# Patient Record
Sex: Female | Born: 1980 | Race: Black or African American | Hispanic: No | Marital: Single | Smoking: Never smoker
Health system: Southern US, Community
[De-identification: ages and names within clinical notes are randomized; demographics above are authoritative.]

## PROBLEM LIST (undated history)

## (undated) HISTORY — PX: TUBAL LIGATION: SHX77

---
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
## 2001-01-25 ENCOUNTER — Inpatient Hospital Stay (HOSPITAL_COMMUNITY): Admission: AD | Admit: 2001-01-25 | Discharge: 2001-01-25 | Payer: Self-pay | Admitting: *Deleted

## 2001-04-06 ENCOUNTER — Emergency Department (HOSPITAL_COMMUNITY): Admission: EM | Admit: 2001-04-06 | Discharge: 2001-04-06 | Payer: Self-pay | Admitting: Emergency Medicine

## 2001-07-01 ENCOUNTER — Inpatient Hospital Stay (HOSPITAL_COMMUNITY): Admission: AD | Admit: 2001-07-01 | Discharge: 2001-07-01 | Payer: Self-pay | Admitting: Obstetrics and Gynecology

## 2001-08-10 ENCOUNTER — Emergency Department (HOSPITAL_COMMUNITY): Admission: EM | Admit: 2001-08-10 | Discharge: 2001-08-10 | Payer: Self-pay | Admitting: Emergency Medicine

## 2002-03-07 ENCOUNTER — Inpatient Hospital Stay (HOSPITAL_COMMUNITY): Admission: AD | Admit: 2002-03-07 | Discharge: 2002-03-07 | Payer: Self-pay | Admitting: Obstetrics and Gynecology

## 2002-10-30 ENCOUNTER — Inpatient Hospital Stay (HOSPITAL_COMMUNITY): Admission: AD | Admit: 2002-10-30 | Discharge: 2002-10-30 | Payer: Self-pay | Admitting: *Deleted

## 2003-01-23 ENCOUNTER — Encounter: Payer: Self-pay | Admitting: Family Medicine

## 2003-01-23 ENCOUNTER — Inpatient Hospital Stay (HOSPITAL_COMMUNITY): Admission: AD | Admit: 2003-01-23 | Discharge: 2003-01-23 | Payer: Self-pay | Admitting: Family Medicine

## 2003-03-01 ENCOUNTER — Inpatient Hospital Stay (HOSPITAL_COMMUNITY): Admission: AD | Admit: 2003-03-01 | Discharge: 2003-03-01 | Payer: Self-pay | Admitting: Obstetrics

## 2003-04-17 ENCOUNTER — Inpatient Hospital Stay (HOSPITAL_COMMUNITY): Admission: AD | Admit: 2003-04-17 | Discharge: 2003-04-17 | Payer: Self-pay | Admitting: Obstetrics & Gynecology

## 2003-06-07 ENCOUNTER — Inpatient Hospital Stay (HOSPITAL_COMMUNITY): Admission: AD | Admit: 2003-06-07 | Discharge: 2003-06-08 | Payer: Self-pay | Admitting: Obstetrics

## 2003-08-24 ENCOUNTER — Inpatient Hospital Stay (HOSPITAL_COMMUNITY): Admission: AD | Admit: 2003-08-24 | Discharge: 2003-08-24 | Payer: Self-pay | Admitting: Obstetrics

## 2003-09-13 ENCOUNTER — Inpatient Hospital Stay (HOSPITAL_COMMUNITY): Admission: AD | Admit: 2003-09-13 | Discharge: 2003-09-15 | Payer: Self-pay | Admitting: Obstetrics

## 2003-09-23 ENCOUNTER — Encounter: Admission: RE | Admit: 2003-09-23 | Discharge: 2003-10-23 | Payer: Self-pay | Admitting: Obstetrics

## 2003-11-23 ENCOUNTER — Encounter: Admission: RE | Admit: 2003-11-23 | Discharge: 2003-12-23 | Payer: Self-pay | Admitting: Obstetrics

## 2004-05-11 ENCOUNTER — Emergency Department (HOSPITAL_COMMUNITY): Admission: EM | Admit: 2004-05-11 | Discharge: 2004-05-11 | Payer: Self-pay | Admitting: Emergency Medicine

## 2004-06-14 ENCOUNTER — Inpatient Hospital Stay (HOSPITAL_COMMUNITY): Admission: AD | Admit: 2004-06-14 | Discharge: 2004-06-14 | Payer: Self-pay | Admitting: Obstetrics

## 2004-06-28 ENCOUNTER — Ambulatory Visit (HOSPITAL_COMMUNITY): Admission: RE | Admit: 2004-06-28 | Discharge: 2004-06-28 | Payer: Self-pay | Admitting: Obstetrics

## 2005-01-27 ENCOUNTER — Inpatient Hospital Stay (HOSPITAL_COMMUNITY): Admission: AD | Admit: 2005-01-27 | Discharge: 2005-01-27 | Payer: Self-pay | Admitting: Obstetrics

## 2005-06-20 ENCOUNTER — Emergency Department (HOSPITAL_COMMUNITY): Admission: EM | Admit: 2005-06-20 | Discharge: 2005-06-20 | Payer: Self-pay | Admitting: Emergency Medicine

## 2006-09-11 ENCOUNTER — Emergency Department (HOSPITAL_COMMUNITY): Admission: EM | Admit: 2006-09-11 | Discharge: 2006-09-11 | Payer: Self-pay | Admitting: Emergency Medicine

## 2007-02-24 ENCOUNTER — Emergency Department (HOSPITAL_COMMUNITY): Admission: EM | Admit: 2007-02-24 | Discharge: 2007-02-24 | Payer: Self-pay | Admitting: Emergency Medicine

## 2009-06-18 ENCOUNTER — Emergency Department (HOSPITAL_COMMUNITY): Admission: EM | Admit: 2009-06-18 | Discharge: 2009-06-18 | Payer: Self-pay | Admitting: Emergency Medicine

## 2009-06-21 ENCOUNTER — Emergency Department (HOSPITAL_COMMUNITY): Admission: EM | Admit: 2009-06-21 | Discharge: 2009-06-21 | Payer: Self-pay | Admitting: Emergency Medicine

## 2010-07-01 ENCOUNTER — Encounter: Payer: Self-pay | Admitting: Obstetrics

## 2010-07-15 ENCOUNTER — Emergency Department (HOSPITAL_COMMUNITY): Payer: No Typology Code available for payment source

## 2010-07-15 ENCOUNTER — Emergency Department (HOSPITAL_COMMUNITY)
Admission: EM | Admit: 2010-07-15 | Discharge: 2010-07-15 | Disposition: A | Discharge status: Home / Self Care | Payer: No Typology Code available for payment source | Attending: Emergency Medicine | Admitting: Emergency Medicine

## 2010-07-15 DIAGNOSIS — S8990XA Unspecified injury of unspecified lower leg, initial encounter: Secondary | ICD-10-CM | POA: Insufficient documentation

## 2010-07-15 DIAGNOSIS — W208XXA Other cause of strike by thrown, projected or falling object, initial encounter: Secondary | ICD-10-CM | POA: Insufficient documentation

## 2010-07-15 DIAGNOSIS — M79609 Pain in unspecified limb: Secondary | ICD-10-CM | POA: Insufficient documentation

## 2010-07-15 DIAGNOSIS — S9030XA Contusion of unspecified foot, initial encounter: Secondary | ICD-10-CM | POA: Insufficient documentation

## 2010-10-26 NOTE — Op Note (Signed)
NAME:  Angie Key, Angie Key                ACCOUNT NO.:  [id number]   MEDICAL RECORD NO.:  [id number]          PATIENT TYPE:  AMB   LOCATION:  SDC                           FACILITY:  [facility]   PHYSICIAN:  Kathreen Cosier, M.D.DATE OF BIRTH:  06-Dec-1980   DATE OF PROCEDURE:  06/28/2004  DATE OF DISCHARGE:                                 OPERATIVE REPORT   PREOPERATIVE DIAGNOSIS:  Multiparity, desirous of laparoscopic tubal  sterilization.   DESCRIPTION OF PROCEDURE:  Under general anesthesia with the patient in the  lithotomy position, the abdomen, perineum and vagina were prepped and  draped.  Bladder emptied with a straight catheter.  Speculum placed in the  vagina.  Anterior lip of the cervix grasped with a Hulka tenaculum.  In the  umbilicus, a transverse incision was made and carried down through the  fascia.  The fascia was cleaned and grasped with two Kochers.  The fascia in  the peritoneum was opened with Mayo scissors.  The sleeve of the trocar was  inserted intraperitoneally.  Three liters of carbon dioxide were infused  intraperitoneally.  Visualizing scope inserted through the sleeve of the  trocar.  Uterus, tubes and ovaries normal.  Cautery probe inserted through  the sleeve of the scope.  The right tube was grasped 1 inch from the cornu  and cauterized.  The tube was cauterized at a total of four places moving  lateral from the first site of cautery.  Procedure done in exact fashion on  the other side.  The patient tolerated the procedure well.  CO2 allowed to  escape from the peritoneal cavity.  Fascia closed with one stitch of 0 Dexon  and the skin closed with subcuticular 3-0 Novafil.      BAM/MEDQ  D:  06/28/2004  T:  06/28/2004  Job:  [id number]

## 2010-11-19 ENCOUNTER — Inpatient Hospital Stay (INDEPENDENT_AMBULATORY_CARE_PROVIDER_SITE_OTHER)
Admission: RE | Admit: 2010-11-19 | Discharge: 2010-11-19 | Disposition: A | Discharge status: Home / Self Care | Payer: No Typology Code available for payment source | Source: Ambulatory Visit | Attending: Emergency Medicine | Admitting: Emergency Medicine

## 2010-11-19 DIAGNOSIS — R42 Dizziness and giddiness: Secondary | ICD-10-CM

## 2010-11-19 LAB — POCT PREGNANCY, URINE: Preg Test, Ur: NEGATIVE

## 2010-11-19 LAB — POCT I-STAT, CHEM 8
BUN: 17 mg/dL (ref 6–23)
Creatinine, Ser: 1 mg/dL (ref 0.4–1.2)
Potassium: 3.8 mEq/L (ref 3.5–5.1)
Sodium: 142 mEq/L (ref 135–145)
TCO2: 23 mmol/L (ref 0–100)

## 2011-03-15 ENCOUNTER — Emergency Department (HOSPITAL_COMMUNITY)
Admission: EM | Admit: 2011-03-15 | Discharge: 2011-03-15 | Disposition: A | Discharge status: Home / Self Care | Payer: No Typology Code available for payment source | Attending: Emergency Medicine | Admitting: Emergency Medicine

## 2011-03-15 ENCOUNTER — Emergency Department (HOSPITAL_COMMUNITY): Payer: No Typology Code available for payment source

## 2011-03-15 DIAGNOSIS — M546 Pain in thoracic spine: Secondary | ICD-10-CM | POA: Insufficient documentation

## 2011-03-15 DIAGNOSIS — S139XXA Sprain of joints and ligaments of unspecified parts of neck, initial encounter: Secondary | ICD-10-CM | POA: Insufficient documentation

## 2011-03-15 DIAGNOSIS — S40029A Contusion of unspecified upper arm, initial encounter: Secondary | ICD-10-CM | POA: Insufficient documentation

## 2011-03-15 DIAGNOSIS — Y92410 Unspecified street and highway as the place of occurrence of the external cause: Secondary | ICD-10-CM | POA: Insufficient documentation

## 2011-03-18 ENCOUNTER — Emergency Department (HOSPITAL_COMMUNITY)
Admission: EM | Admit: 2011-03-18 | Discharge: 2011-03-18 | Disposition: A | Discharge status: Home / Self Care | Payer: No Typology Code available for payment source | Attending: Emergency Medicine | Admitting: Emergency Medicine

## 2011-03-18 ENCOUNTER — Emergency Department (HOSPITAL_COMMUNITY): Payer: No Typology Code available for payment source

## 2011-03-18 DIAGNOSIS — R51 Headache: Secondary | ICD-10-CM | POA: Insufficient documentation

## 2011-03-18 DIAGNOSIS — R404 Transient alteration of awareness: Secondary | ICD-10-CM | POA: Insufficient documentation

## 2011-03-18 DIAGNOSIS — R110 Nausea: Secondary | ICD-10-CM | POA: Insufficient documentation

## 2011-03-18 DIAGNOSIS — S0990XA Unspecified injury of head, initial encounter: Secondary | ICD-10-CM | POA: Insufficient documentation

## 2011-07-24 ENCOUNTER — Emergency Department (HOSPITAL_COMMUNITY)
Admission: EM | Admit: 2011-07-24 | Discharge: 2011-07-24 | Disposition: A | Discharge status: Home / Self Care | Payer: Self-pay | Attending: Emergency Medicine | Admitting: Emergency Medicine

## 2011-07-24 ENCOUNTER — Encounter (HOSPITAL_COMMUNITY): Payer: Self-pay

## 2011-07-24 DIAGNOSIS — L03011 Cellulitis of right finger: Secondary | ICD-10-CM

## 2011-07-24 DIAGNOSIS — M79609 Pain in unspecified limb: Secondary | ICD-10-CM | POA: Insufficient documentation

## 2011-07-24 DIAGNOSIS — L03019 Cellulitis of unspecified finger: Secondary | ICD-10-CM | POA: Insufficient documentation

## 2011-07-24 DIAGNOSIS — L02519 Cutaneous abscess of unspecified hand: Secondary | ICD-10-CM | POA: Insufficient documentation

## 2011-07-24 DIAGNOSIS — M7989 Other specified soft tissue disorders: Secondary | ICD-10-CM | POA: Insufficient documentation

## 2011-07-24 MED ORDER — CEPHALEXIN 500 MG PO CAPS: 500.0000 mg | ORAL_CAPSULE | Freq: Four times a day (QID) | ORAL | Status: AC

## 2011-07-24 NOTE — Discharge Instructions (Signed)
Please read over the instructions below. The infection and the tip of the right index finger may be an early paronychia. There is no indication to open the wound at this time. Take the antibiotic as directed and be sure to complete. He should soak the finger several times a day in warm water for 15-20 minutes. If the finger is improving over the next 48 hours then continue the anabiotic until it is complete. However if you feel the finger swelling pain and redness has worsened and returned to the current [facility] care facility for a wound recheck. Return to the emergency department for worsening symptoms

## 2011-07-24 NOTE — ED Provider Notes (Signed)
History     CSN: [id number]  Arrival date & time 07/24/11  1842   First MD Initiated Contact with Patient 07/24/11 2121      Chief Complaint  Patient presents with  . Hand Pain     Patient is a 31 y.o. female presenting with hand pain. The history is provided by the patient.  Hand Pain This is a new problem. The current episode started yesterday. The problem occurs constantly. The problem has been gradually worsening. Pertinent negatives include no fever.  Pt reports mild soreness to her (R) index fingertip yesterday that has persisted. Denies injury though pt admits to biting on her cuticles at times.. States noted some mild redness to the lateral aspect of the fingertip today and was concerned for infection.  History reviewed. No pertinent past medical history.  Past Surgical History  Procedure Date  . Tubal ligation     No family history on file.  History  Substance Use Topics  . Smoking status: Not on file  . Smokeless tobacco: Not on file  . Alcohol Use: Yes    OB History    Grav Para Term Preterm Abortions TAB SAB Ect Mult Living                  Review of Systems  Constitutional: Negative.  Negative for fever.  HENT: Negative.   Eyes: Negative.   Respiratory: Negative.   Cardiovascular: Negative.   Gastrointestinal: Negative.   Genitourinary: Negative.   Musculoskeletal: Negative.   Skin: Negative.   Neurological: Negative.   Hematological: Negative.   Psychiatric/Behavioral: Negative.     Allergies  Review of patient's allergies indicates no known allergies.  Home Medications   Current Outpatient Rx  Name Route Sig Dispense Refill  . CEPHALEXIN 500 MG PO CAPS Oral Take 1 capsule (500 mg total) by mouth 4 (four) times daily. 28 capsule 0    BP 116/79  Pulse 87  Temp(Src) 98.1 F (36.7 C) (Oral)  Resp 16  SpO2 97%  LMP 07/15/2011  Physical Exam  Constitutional: She is oriented to person, place, and time. She appears well-developed and  well-nourished.  HENT:  Head: Normocephalic and atraumatic.  Eyes: Conjunctivae are normal.  Cardiovascular: Normal rate.   Pulmonary/Chest: Effort normal.  Musculoskeletal: Normal range of motion.       Hands:      Mild erythema and minimal swelling to lateral aspect of (R) index finger parallel w/ nailbed. There is no palpable fluctuance. Findings c/w early paronychia.  Neurological: She is alert and oriented to person, place, and time.  Skin: Skin is warm and dry. No erythema.  Psychiatric: She has a normal mood and affect.    ED Course  Procedures   Findings and clinical impression discussed w/ pt. Will plan to discharge home on antibiotics, instructions for warm soaks and encourage patient to follow up at the [facility] if finger not improving over the next 1 to 2 days. Patient agreeable with plan.  Labs Reviewed - No data to display No results found.   1. Cellulitis of finger, right       MDM  HPI/PE and clinical findings c/w  1. Mild cellulitis to (R) index fingertip (Likely early paronychia)        Leanne Chang, NP 07/24/11 2226

## 2011-07-24 NOTE — ED Notes (Signed)
Patient discharged home with prescription.  Verbalized understanding

## 2011-07-24 NOTE — ED Notes (Signed)
Patient has red area noted to index finger right hand.  States painful x 2 days.

## 2011-07-24 NOTE — ED Notes (Signed)
Pt presented to the ER with pain and swelling to the rt index finger x 2 days

## 2011-07-25 NOTE — ED Provider Notes (Signed)
Medical screening examination/treatment/procedure(s) were performed by non-physician practitioner and as supervising physician I was immediately available for consultation/collaboration.  Doug Sou, MD 07/25/11 [phone]

## 2011-10-31 ENCOUNTER — Emergency Department (HOSPITAL_COMMUNITY)
Admission: EM | Admit: 2011-10-31 | Discharge: 2011-10-31 | Disposition: A | Discharge status: Home / Self Care | Payer: Self-pay | Attending: Emergency Medicine | Admitting: Emergency Medicine

## 2011-10-31 ENCOUNTER — Encounter (HOSPITAL_COMMUNITY): Payer: Self-pay | Admitting: Emergency Medicine

## 2011-10-31 DIAGNOSIS — R51 Headache: Secondary | ICD-10-CM | POA: Insufficient documentation

## 2011-10-31 DIAGNOSIS — J029 Acute pharyngitis, unspecified: Secondary | ICD-10-CM | POA: Insufficient documentation

## 2011-10-31 LAB — RAPID STREP SCREEN (MED CTR MEBANE ONLY): Streptococcus, Group A Screen (Direct): NEGATIVE

## 2011-10-31 MED ORDER — KETOROLAC TROMETHAMINE 30 MG/ML IJ SOLN
30.0000 mg | Freq: Once | INTRAMUSCULAR | Status: AC
  Administered 2011-10-31: 30 mg via INTRAMUSCULAR
  Filled 2011-10-31: qty 1

## 2011-10-31 MED ORDER — IBUPROFEN 50 MG PO CHEW: 600.0000 mg | CHEWABLE_TABLET | Freq: Four times a day (QID) | ORAL | Status: AC | PRN

## 2011-10-31 NOTE — Discharge Instructions (Signed)
Your strep test is negative.  U. been given a prescription for chewable ibuprofen for comfort, you can also use a decongestant, like Benadryl or Sudafed for congestion

## 2011-10-31 NOTE — ED Notes (Signed)
Patient states she had headache yesterday, subsided mostly today, now with sore throat.

## 2011-10-31 NOTE — ED Provider Notes (Signed)
History     CSN: [id number]  Arrival date & time 10/31/11  2009   None     Chief Complaint  Patient presents with  . Sore Throat    (Consider location/radiation/quality/duration/timing/severity/associated sxs/prior treatment) HPI Comments: A tissue study yesterday with a headache, today.  She's still has a headache, sore throat, facial fullness, without rhinorrhea or history of sinusitis.  Denies fever, or chills, ill contacts  Patient is a 31 y.o. female presenting with pharyngitis. The history is provided by the patient.  Sore Throat This is a new problem. The current episode started yesterday. The problem has been gradually worsening. Associated symptoms include headaches and a sore throat. Pertinent negatives include no abdominal pain, anorexia, arthralgias, chills, congestion, coughing, fatigue, fever, myalgias, nausea, neck pain or swollen glands.    History reviewed. No pertinent past medical history.  Past Surgical History  Procedure Date  . Tubal ligation     History reviewed. No pertinent family history.  History  Substance Use Topics  . Smoking status: Never Smoker   . Smokeless tobacco: Not on file  . Alcohol Use: Yes    OB History    Grav Para Term Preterm Abortions TAB SAB Ect Mult Living                  Review of Systems  Constitutional: Negative for fever, chills and fatigue.  HENT: Positive for sore throat. Negative for congestion and neck pain.   Respiratory: Negative for cough.   Gastrointestinal: Negative for nausea, abdominal pain and anorexia.  Musculoskeletal: Negative for myalgias and arthralgias.  Neurological: Positive for headaches.    Allergies  Review of patient's allergies indicates no known allergies.  Home Medications   Current Outpatient Rx  Name Route Sig Dispense Refill  . IBUPROFEN 50 MG PO CHEW Oral Chew 12 tablets (600 mg total) by mouth every 6 (six) hours as needed for fever. 30 tablet 0    BP 121/78  Pulse 78   Temp(Src) 98.4 F (36.9 C) (Oral)  Resp 16  SpO2 99%  LMP 10/19/2011  Physical Exam  Constitutional: She is oriented to person, place, and time. She appears well-developed and well-nourished.  HENT:  Head: No trismus in the jaw.  Mouth/Throat: Uvula is midline, oropharynx is clear and moist and mucous membranes are normal. No dental abscesses or uvula swelling. No tonsillar abscesses.  Eyes: Pupils are equal, round, and reactive to light.  Neck: Normal range of motion.  Cardiovascular: Normal rate.   Pulmonary/Chest: Effort normal.  Musculoskeletal: Normal range of motion.  Neurological: She is alert and oriented to person, place, and time.  Skin: Skin is warm.    ED Course  Procedures (including critical care time)   Labs Reviewed  RAPID STREP SCREEN   No results found.   1. Viral pharyngitis       MDM  There is no outward appearance of strep throat, but will obtain rapid test.  Due to patient's symptoms, headache and congestion        Arman Filter, NP 10/31/11 2308

## 2011-11-03 NOTE — ED Provider Notes (Signed)
Medical screening examination/treatment/procedure(s) were conducted as a shared visit with non-physician practitioner(s) and myself.  I personally evaluated the patient during the encounter  Flint Melter, MD 11/03/11 [phone]

## 2012-06-26 ENCOUNTER — Encounter (HOSPITAL_COMMUNITY): Payer: Self-pay

## 2012-06-26 ENCOUNTER — Emergency Department (HOSPITAL_COMMUNITY)
Admission: EM | Admit: 2012-06-26 | Discharge: 2012-06-26 | Disposition: A | Discharge status: Home / Self Care | Payer: Self-pay | Attending: Emergency Medicine | Admitting: Emergency Medicine

## 2012-06-26 DIAGNOSIS — L259 Unspecified contact dermatitis, unspecified cause: Secondary | ICD-10-CM | POA: Insufficient documentation

## 2012-06-26 DIAGNOSIS — T7840XA Allergy, unspecified, initial encounter: Secondary | ICD-10-CM | POA: Insufficient documentation

## 2012-06-26 DIAGNOSIS — T498X5A Adverse effect of other topical agents, initial encounter: Secondary | ICD-10-CM | POA: Insufficient documentation

## 2012-06-26 MED ORDER — PREDNISONE 20 MG PO TABS
60.0000 mg | ORAL_TABLET | Freq: Once | ORAL | Status: AC
  Administered 2012-06-26: 60 mg via ORAL
  Filled 2012-06-26: qty 1
  Filled 2012-06-26: qty 2

## 2012-06-26 MED ORDER — FAMOTIDINE 20 MG PO TABS
20.0000 mg | ORAL_TABLET | Freq: Once | ORAL | Status: AC
  Administered 2012-06-26: 20 mg via ORAL
  Filled 2012-06-26: qty 1

## 2012-06-26 MED ORDER — FAMOTIDINE 20 MG PO TABS: 20.0000 mg | ORAL_TABLET | Freq: Two times a day (BID) | ORAL | Status: DC

## 2012-06-26 MED ORDER — PREDNISONE 20 MG PO TABS: 40.0000 mg | ORAL_TABLET | Freq: Every day | ORAL | Status: DC

## 2012-06-26 MED ORDER — DIPHENHYDRAMINE HCL 25 MG PO TABS: 25.0000 mg | ORAL_TABLET | Freq: Four times a day (QID) | ORAL | Status: DC | PRN

## 2012-06-26 MED ORDER — DIPHENHYDRAMINE HCL 50 MG/ML IJ SOLN
25.0000 mg | Freq: Once | INTRAMUSCULAR | Status: AC
  Administered 2012-06-26: 25 mg via INTRAMUSCULAR
  Filled 2012-06-26: qty 1

## 2012-06-26 NOTE — ED Provider Notes (Signed)
History   This chart was scribed for non-physician practitioner working with Carleene Cooper III, MD by Frederik Pear, ED Scribe. This patient was seen in room TR09C/TR09C and the patient's care was started at 1720.   CSN: [id number]  Arrival date & time 06/26/12  1536   First MD Initiated Contact with Patient 06/26/12 1720      Chief Complaint  Patient presents with  . Rash    (Consider location/radiation/quality/duration/timing/severity/associated sxs/prior treatment) The history is provided by the patient and medical records.    Angie Key is a 32 y.o. female who presents to the Emergency Department complaining of of a gradually worsening, persistent, constant rash with associated itching that began on the back of her neck 3 days ago and has since spread to the rest of her body. She reports that she recently changed body wash and laundry detergent. She denies any associated SOB, wheezing, feelings of her throat closing, stridor. She reports that she took Benadryl last night, but fell asleep before she noticed any relief. She denies any chronic medical conditions that require daily medication.  History reviewed. No pertinent past medical history.  Past Surgical History  Procedure Date  . Tubal ligation     No family history on file.  History  Substance Use Topics  . Smoking status: Never Smoker   . Smokeless tobacco: Not on file  . Alcohol Use: Yes    OB History    Grav Para Term Preterm Abortions TAB SAB Ect Mult Living                  Review of Systems  Constitutional: Negative for fever, diaphoresis, appetite change, fatigue and unexpected weight change.  HENT: Negative for mouth sores and neck stiffness.   Eyes: Negative for visual disturbance.  Respiratory: Negative for cough, chest tightness, shortness of breath and wheezing.   Cardiovascular: Negative for chest pain.  Gastrointestinal: Negative for nausea, vomiting, abdominal pain, diarrhea and  constipation.  Genitourinary: Negative for dysuria, urgency, frequency and hematuria.  Musculoskeletal: Negative for back pain.  Skin: Positive for rash.  Neurological: Negative for syncope, light-headedness and headaches.  Hematological: Does not bruise/bleed easily.  Psychiatric/Behavioral: Negative for sleep disturbance. The patient is not nervous/anxious.   All other systems reviewed and are negative.    Allergies  Review of patient's allergies indicates no known allergies.  Home Medications   Current Outpatient Rx  Name  Route  Sig  Dispense  Refill  . BENADRYL PO   Oral   Take 1 tablet by mouth at bedtime as needed. For itching         . DIPHENHYDRAMINE HCL 25 MG PO TABS   Oral   Take 1 tablet (25 mg total) by mouth every 6 (six) hours as needed for itching (Rash).   30 tablet   0   . FAMOTIDINE 20 MG PO TABS   Oral   Take 1 tablet (20 mg total) by mouth 2 (two) times daily.   30 tablet   0   . PREDNISONE 20 MG PO TABS   Oral   Take 2 tablets (40 mg total) by mouth daily.   10 tablet   0     BP 124/84  Pulse 79  Temp 97.5 F (36.4 C) (Oral)  Resp 16  SpO2 100%  LMP 06/19/2012  Physical Exam  Nursing note and vitals reviewed. Constitutional: She is oriented to person, place, and time. She appears well-developed and well-nourished. No distress.  HENT:  Head: Normocephalic and atraumatic.  Right Ear: Tympanic membrane, external ear and ear canal normal.  Left Ear: Tympanic membrane, external ear and ear canal normal.  Nose: Nose normal. No mucosal edema or rhinorrhea. Right sinus exhibits no maxillary sinus tenderness and no frontal sinus tenderness. Left sinus exhibits no maxillary sinus tenderness and no frontal sinus tenderness.  Mouth/Throat: Uvula is midline, oropharynx is clear and moist and mucous membranes are normal. Mucous membranes are not cyanotic. No uvula swelling. No oropharyngeal exudate, posterior oropharyngeal edema, posterior  oropharyngeal erythema or tonsillar abscesses.  Eyes: Conjunctivae normal are normal. Pupils are equal, round, and reactive to light. No scleral icterus.  Neck: Normal range of motion, full passive range of motion without pain and phonation normal. Neck supple. No tracheal tenderness and no muscular tenderness present.  Cardiovascular: Normal rate, regular rhythm, normal heart sounds and intact distal pulses.  Exam reveals no gallop and no friction rub.   No murmur heard. Pulmonary/Chest: Effort normal and breath sounds normal. No stridor. No respiratory distress. She has no wheezes. She has no rales. She exhibits no tenderness.  Abdominal: Soft. Bowel sounds are normal. She exhibits no distension and no mass. There is no tenderness. There is no rebound and no guarding.  Musculoskeletal: Normal range of motion. She exhibits no edema and no tenderness.  Neurological: She is alert and oriented to person, place, and time. She exhibits normal muscle tone. Coordination normal.       Speech is clear and goal oriented Moves extremities without ataxia  Skin: Skin is warm and dry. Rash noted. No petechiae and no purpura noted. Rash is urticarial. Rash is not vesicular. She is not diaphoretic. No pallor.       She has an urticarial rash over her neck, trunk, back, arms, and legs. She is actively scratching. She has excoriations over most areas of the rash.  No vesicles, purpura or petechiae.  No evidence of cellulitis.  Psychiatric: She has a normal mood and affect.    ED Course  Procedures (including critical care time)  DIAGNOSTIC STUDIES: Oxygen Saturation is 100% on room air, normal by my interpretation.    COORDINATION OF CARE:  18:09- Discussed planned course of treatment with the patient, including Benadryl, Hydrocortisone cream, a steroid, and an antihistamine as well as changing laundry detergent and body wash, who is agreeable at this time.  18:30- Medication Orders- diphenhydramine  (Benadryl) injection 25 mg- once. Famotidine (pepcid) tablet 20 mg- once, prednisone (delatsone) tablet 60 mg- once.  7:06 PM - the patient reassessed with decrease in rash and itching.  She states she feels better.  Labs Reviewed - No data to display No results found.   1. Contact dermatitis   2. Allergic reaction       MDM  Becky Sax presents with allergic reaction to new soap and detergent.  Patient re-evaluated prior to dc, is hemodynamically stable, in no respiratory distress, and denies the feeling of throat closing. Pt has been advised to take OTC benadryl, will be prescribed Pepcid and prednisone & return to the ED if they have a mod-severe allergic rxn (s/s including throat closing, difficulty breathing, swelling of lips face or tongue). Pt is to follow up with their PCP. Pt is agreeable with plan & verbalizes understanding.  1. Medications: Benadryl, Ativan, Pepcid, cortisone lotion usual home medications 2. Treatment: rest, drink plenty of fluids, take medications as prescribed, use lotion as prescribed 3. Follow Up: Please followup with your primary  doctor for discussion of your diagnoses and further evaluation after today's visit;  return to the emergency department if you have symptoms of your throat closing, difficulty breathing or swelling of the lips or tongue.  I personally performed the services described in this documentation, which was scribed in my presence. The recorded information has been reviewed and is accurate.        Dierdre Forth, PA-C 06/26/12 1906

## 2012-06-26 NOTE — ED Notes (Signed)
Rash began on Monday behind her neck and now has spread all over her body.  Very itchy , no other family member has it.  Did change her soap powder and  Body wash.

## 2012-06-27 NOTE — ED Provider Notes (Signed)
Medical screening examination/treatment/procedure(s) were performed by non-physician practitioner and as supervising physician I was immediately available for consultation/collaboration.   Carleene Cooper III, MD 06/27/12 1150

## 2012-07-26 ENCOUNTER — Encounter (HOSPITAL_COMMUNITY): Payer: Self-pay | Admitting: Nurse Practitioner

## 2012-07-26 ENCOUNTER — Emergency Department (HOSPITAL_COMMUNITY): Payer: Medicaid Other

## 2012-07-26 ENCOUNTER — Emergency Department (HOSPITAL_COMMUNITY)
Admission: EM | Admit: 2012-07-26 | Discharge: 2012-07-26 | Disposition: A | Discharge status: Home / Self Care | Payer: Medicaid Other | Attending: Emergency Medicine | Admitting: Emergency Medicine

## 2012-07-26 DIAGNOSIS — M79643 Pain in unspecified hand: Secondary | ICD-10-CM

## 2012-07-26 DIAGNOSIS — W010XXA Fall on same level from slipping, tripping and stumbling without subsequent striking against object, initial encounter: Secondary | ICD-10-CM | POA: Insufficient documentation

## 2012-07-26 DIAGNOSIS — Y939 Activity, unspecified: Secondary | ICD-10-CM | POA: Insufficient documentation

## 2012-07-26 DIAGNOSIS — Y929 Unspecified place or not applicable: Secondary | ICD-10-CM | POA: Insufficient documentation

## 2012-07-26 DIAGNOSIS — S6990XA Unspecified injury of unspecified wrist, hand and finger(s), initial encounter: Secondary | ICD-10-CM | POA: Insufficient documentation

## 2012-07-26 MED ORDER — IBUPROFEN 600 MG PO TABS: 600.0000 mg | ORAL_TABLET | Freq: Four times a day (QID) | ORAL | Status: DC | PRN

## 2012-07-26 NOTE — ED Provider Notes (Signed)
History    This chart was scribed for non-physician practitioner working with Dione Booze, MD by Frederik Pear, ED Scribe. This patient was seen in room TR07C/TR07C and the patient's care was started at 1805.     CSN: [id number]  Arrival date & time 07/26/12  1743   First MD Initiated Contact with Patient 07/26/12 1805      Chief Complaint  Patient presents with  . Hand Injury    (Consider location/radiation/quality/duration/timing/severity/associated sxs/prior treatment) The history is provided by the patient. No language interpreter was used.    Angie Key is a 32 y.o. female who presents to the Emergency Department complaining of sudden onset, constant, non-radiating left hand pain that began early this morning when she slipped and fell on ice and caught her fall by putting down her left hand. She denies any other symptoms.    History reviewed. No pertinent past medical history.  Past Surgical History  Procedure Laterality Date  . Tubal ligation      History reviewed. No pertinent family history.  History  Substance Use Topics  . Smoking status: Never Smoker   . Smokeless tobacco: Not on file  . Alcohol Use: Yes    OB History   Grav Para Term Preterm Abortions TAB SAB Ect Mult Living                  Review of Systems  Constitutional: Negative for activity change.  HENT: Negative for neck pain.   Musculoskeletal: Positive for arthralgias. Negative for back pain and joint swelling.       Hand pain  Skin: Negative for wound.  Neurological: Negative for weakness and numbness.    Allergies  Review of patient's allergies indicates no known allergies.  Home Medications   Current Outpatient Rx  Name  Route  Sig  Dispense  Refill  . ibuprofen (ADVIL,MOTRIN) 600 MG tablet   Oral   Take 1 tablet (600 mg total) by mouth every 6 (six) hours as needed for pain.   20 tablet   0     BP 133/88  Pulse 67  Temp(Src) 97.4 F (36.3 C) (Oral)  Resp 15  SpO2  98%  LMP 07/13/2012  Physical Exam  Nursing note and vitals reviewed. Constitutional: She appears well-developed and well-nourished. No distress.  HENT:  Head: Normocephalic and atraumatic.  Eyes: EOM are normal. Pupils are equal, round, and reactive to light.  Neck: Normal range of motion. Neck supple. No tracheal deviation present.  Cardiovascular: Normal rate.  Exam reveals no decreased pulses.   Pulmonary/Chest: Effort normal. No respiratory distress.  Abdominal: Soft. She exhibits no distension.  Musculoskeletal: Normal range of motion. She exhibits tenderness. She exhibits no edema.       Left hand: She exhibits tenderness. She exhibits normal range of motion and normal capillary refill. Normal sensation noted. Normal strength noted.       Hands: Tenderness at the base of the left palm without redness or swelling. No snuff box tenderness.  Neurological: She is alert. No sensory deficit.  Motor, sensation, and vascular distal to the injury is fully intact.   Skin: Skin is warm and dry.  Psychiatric: She has a normal mood and affect. Her behavior is normal.    ED Course  Procedures (including critical care time)  DIAGNOSTIC STUDIES: Oxygen Saturation is 100% on room air, normal by my interpretation.    COORDINATION OF CARE:  19:02- Discussed planned course of treatment with the patient, including ibuprofen,  applying ice, and following up with a hand surgeon if symptoms do not imrpove, who is agreeable at this time.   Labs Reviewed - No data to display Dg Hand Complete Left  07/26/2012  *RADIOLOGY REPORT*  Clinical Data: Thumb and index finger pain status post fall on placed  LEFT HAND - COMPLETE 3+ VIEW  Comparison: None  Findings: No acute fracture, malalignment or focal soft tissue swelling.  Normal bony mineralization.  IMPRESSION: Negative radiographs.   Original Report Authenticated By: Malachy Moan, M.D.      1. Asthma exacerbation   2. Rash   3. Pain, dental     Vital signs reviewed and are as follows: Filed Vitals:   07/26/12 1809  BP: 133/88  Pulse: 67  Temp: 97.4 F (36.3 C)  Resp: 15   Patient was counseled on RICE protocol and told to rest injury, use ice for no longer than 15 minutes every hour, compress the area, and elevate above the level of their heart as much as possible to reduce swelling.  Questions answered.  Patient verbalized understanding.      MDM  Hand pain after fall. Neurovascularly intact. Neg x-rays. RICE protocol.   I personally performed the services described in this documentation, which was scribed in my presence. The recorded information has been reviewed and is accurate.        Renne Crigler, [place] 07/26/12 2358

## 2012-07-26 NOTE — ED Notes (Signed)
Pt fell on ice last night and injured L hand. Bruising to L 4th digit at knuckle and to palm. Cms intact.

## 2012-07-26 NOTE — ED Notes (Signed)
Bruised lt hand she fell on the ice at 0100am today.  She flexes and extends her lt thumb slowly

## 2012-07-27 NOTE — ED Provider Notes (Signed)
Medical screening examination/treatment/procedure(s) were performed by non-physician practitioner and as supervising physician I was immediately available for consultation/collaboration.   Evertte Sones, MD 07/27/12 0054 
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
## 2013-05-03 ENCOUNTER — Emergency Department (HOSPITAL_COMMUNITY): Payer: Medicaid Other

## 2013-05-03 ENCOUNTER — Emergency Department (HOSPITAL_COMMUNITY)
Admission: EM | Admit: 2013-05-03 | Discharge: 2013-05-03 | Disposition: A | Discharge status: Home / Self Care | Payer: Medicaid Other | Attending: Emergency Medicine | Admitting: Emergency Medicine

## 2013-05-03 ENCOUNTER — Encounter (HOSPITAL_COMMUNITY): Payer: Self-pay | Admitting: Emergency Medicine

## 2013-05-03 DIAGNOSIS — Y9389 Activity, other specified: Secondary | ICD-10-CM | POA: Insufficient documentation

## 2013-05-03 DIAGNOSIS — R079 Chest pain, unspecified: Secondary | ICD-10-CM

## 2013-05-03 DIAGNOSIS — S79919A Unspecified injury of unspecified hip, initial encounter: Secondary | ICD-10-CM | POA: Insufficient documentation

## 2013-05-03 DIAGNOSIS — M542 Cervicalgia: Secondary | ICD-10-CM

## 2013-05-03 DIAGNOSIS — S298XXA Other specified injuries of thorax, initial encounter: Secondary | ICD-10-CM | POA: Insufficient documentation

## 2013-05-03 DIAGNOSIS — S0993XA Unspecified injury of face, initial encounter: Secondary | ICD-10-CM | POA: Insufficient documentation

## 2013-05-03 DIAGNOSIS — S0990XA Unspecified injury of head, initial encounter: Secondary | ICD-10-CM | POA: Insufficient documentation

## 2013-05-03 DIAGNOSIS — Y92410 Unspecified street and highway as the place of occurrence of the external cause: Secondary | ICD-10-CM | POA: Insufficient documentation

## 2013-05-03 DIAGNOSIS — M25552 Pain in left hip: Secondary | ICD-10-CM

## 2013-05-03 MED ORDER — IBUPROFEN 800 MG PO TABS
800.0000 mg | ORAL_TABLET | Freq: Once | ORAL | Status: AC
  Administered 2013-05-03: 800 mg via ORAL
  Filled 2013-05-03: qty 1

## 2013-05-03 MED ORDER — OXYCODONE-ACETAMINOPHEN 5-325 MG PO TABS
1.0000 | ORAL_TABLET | Freq: Once | ORAL | Status: AC
  Administered 2013-05-03: 1 via ORAL
  Filled 2013-05-03: qty 1

## 2013-05-03 MED ORDER — IBUPROFEN 600 MG PO TABS: 600.0000 mg | ORAL_TABLET | Freq: Three times a day (TID) | ORAL | Status: DC | PRN

## 2013-05-03 MED ORDER — HYDROCODONE-ACETAMINOPHEN 5-325 MG PO TABS: 1.0000 | ORAL_TABLET | Freq: Four times a day (QID) | ORAL | Status: DC | PRN

## 2013-05-03 NOTE — ED Notes (Signed)
MD Campos at bedside.  
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
## 2014-06-07 ENCOUNTER — Emergency Department (HOSPITAL_COMMUNITY)
Admission: EM | Admit: 2014-06-07 | Discharge: 2014-06-08 | Disposition: A | Discharge status: Home / Self Care | Payer: No Typology Code available for payment source | Attending: Emergency Medicine | Admitting: Emergency Medicine

## 2014-06-07 ENCOUNTER — Encounter (HOSPITAL_COMMUNITY): Payer: Self-pay | Admitting: Emergency Medicine

## 2014-06-07 DIAGNOSIS — Y9289 Other specified places as the place of occurrence of the external cause: Secondary | ICD-10-CM | POA: Insufficient documentation

## 2014-06-07 DIAGNOSIS — F419 Anxiety disorder, unspecified: Secondary | ICD-10-CM | POA: Insufficient documentation

## 2014-06-07 DIAGNOSIS — T7840XA Allergy, unspecified, initial encounter: Secondary | ICD-10-CM | POA: Insufficient documentation

## 2014-06-07 DIAGNOSIS — Y9389 Activity, other specified: Secondary | ICD-10-CM | POA: Insufficient documentation

## 2014-06-07 DIAGNOSIS — R0602 Shortness of breath: Secondary | ICD-10-CM | POA: Insufficient documentation

## 2014-06-07 DIAGNOSIS — Y998 Other external cause status: Secondary | ICD-10-CM | POA: Insufficient documentation

## 2014-06-07 MED ORDER — DIPHENHYDRAMINE HCL 50 MG/ML IJ SOLN
50.0000 mg | Freq: Once | INTRAMUSCULAR | Status: AC
  Administered 2014-06-07: 50 mg via INTRAVENOUS
  Filled 2014-06-07: qty 1

## 2014-06-07 MED ORDER — EPINEPHRINE 0.3 MG/0.3ML IJ SOAJ: 0.3000 mg | Freq: Once | INTRAMUSCULAR | Status: DC

## 2014-06-07 MED ORDER — METHYLPREDNISOLONE SODIUM SUCC 125 MG IJ SOLR
125.0000 mg | Freq: Once | INTRAMUSCULAR | Status: AC
  Administered 2014-06-07: 125 mg via INTRAVENOUS
  Filled 2014-06-07: qty 2

## 2014-06-07 MED ORDER — PREDNISONE 20 MG PO TABS: ORAL_TABLET | ORAL | Status: DC

## 2014-06-07 MED ORDER — FAMOTIDINE IN NACL 20-0.9 MG/50ML-% IV SOLN
20.0000 mg | Freq: Once | INTRAVENOUS | Status: AC
  Administered 2014-06-07: 20 mg via INTRAVENOUS
  Filled 2014-06-07: qty 50

## 2014-06-07 MED ORDER — EPINEPHRINE 0.3 MG/0.3ML IJ SOAJ
0.3000 mg | Freq: Once | INTRAMUSCULAR | Status: AC
  Administered 2014-06-07: 0.3 mg via INTRAMUSCULAR
  Filled 2014-06-07: qty 0.3

## 2014-06-07 NOTE — ED Notes (Signed)
Pts respirations now equal and unlabored, stating she can breathe much better, nad at this time.

## 2014-06-07 NOTE — ED Provider Notes (Signed)
CSN: [id number]     Arrival date & time 06/07/14  2006 History   First MD Initiated Contact with Patient 06/07/14 2019     Chief Complaint  Patient presents with  . Allergic Reaction     (Consider location/radiation/quality/duration/timing/severity/associated sxs/prior Treatment) HPI patient presents with diffuse, pruritic and erythematous rash starting about 20 minutes ago. Patient states she went home and took a shower and noticed much worsening rash. She began developing shortness of breath. She has no oral swelling. No nausea or vomiting. Patient with no previous allergic reactions. No new exposures.     History reviewed. No pertinent past medical history. Past Surgical History  Procedure Laterality Date  . Tubal ligation     Family History  Problem Relation Age of Onset  . Hypertension Mother   . Aneurysm Mother   . Heart failure Mother   . Hypertension Father   . Gout Father    History  Substance Use Topics  . Smoking status: Never Smoker   . Smokeless tobacco: Never Used  . Alcohol Use: Yes     Comment: weekends   OB History    No data available     Review of Systems  Constitutional: Negative for fever and chills.  Respiratory: Positive for shortness of breath. Negative for cough.   Cardiovascular: Negative for chest pain and palpitations.  Gastrointestinal: Negative for nausea, vomiting, abdominal pain and diarrhea.  Musculoskeletal: Negative for myalgias, back pain, neck pain and neck stiffness.  Skin: Positive for rash.  Neurological: Negative for dizziness, light-headedness, numbness and headaches.  Psychiatric/Behavioral: The patient is nervous/anxious.   All other systems reviewed and are negative.     Allergies  Bee venom and Coconut flavor  Home Medications   Prior to Admission medications   Medication Sig Start Date End Date Taking? Authorizing Provider  HYDROcodone-acetaminophen (NORCO/VICODIN) 5-325 MG per tablet Take 1 tablet by mouth  every 6 (six) hours as needed for moderate pain. 05/03/13   Lyanne CoKevin M Campos, MD  ibuprofen (ADVIL,MOTRIN) 600 MG tablet Take 1 tablet (600 mg total) by mouth every 8 (eight) hours as needed. 05/03/13   Lyanne CoKevin M Campos, MD   BP 110/75 mmHg  Pulse 64  Temp(Src) 97.7 F (36.5 C) (Oral)  Resp 14  Ht 5\' 2"  (1.575 m)  Wt 159 lb (72.122 kg)  BMI 29.07 kg/m2  SpO2 99% Physical Exam  Constitutional: She is oriented to person, place, and time. She appears well-developed and well-nourished. She appears distressed.  Patient is very anxious appearing. She speaking in full sentences. No voice changes.  HENT:  Head: Normocephalic and atraumatic.  Mouth/Throat: Oropharynx is clear and moist. No oropharyngeal exudate.  No facial or intraoral swelling.  Eyes: EOM are normal. Pupils are equal, round, and reactive to light.  Neck: Normal range of motion. Neck supple.  Cardiovascular: Normal rate and regular rhythm.   Pulmonary/Chest: Effort normal and breath sounds normal. No stridor. No respiratory distress. She has no wheezes. She has no rales. She exhibits no tenderness.  Abdominal: Soft. Bowel sounds are normal. She exhibits no distension and no mass. There is no tenderness. There is no rebound and no guarding.  Musculoskeletal: Normal range of motion. She exhibits no edema or tenderness.  Neurological: She is alert and oriented to person, place, and time.  Moves all extremities without deficit. Sensation is grossly intact.  Skin: Skin is warm and dry. Rash (diffuse maculopapular rash to trunk and extremities.) noted. No erythema.  Psychiatric: She has a  normal mood and affect. Her behavior is normal.  Nursing note and vitals reviewed.   ED Course  Procedures (including critical care time) Labs Review Labs Reviewed - No data to display  Imaging Review No results found.   EKG Interpretation None      MDM   Final diagnoses:  None    Given EpiPen, as steroids, Pepcid and Benadryl. She  appears more calm. Airway is intact. Airway cart at bedside. We'll continue to monitor closely  Patient's rash is improving. Shortness of breath is resolved. We'll continue to observe  Patient is completely asymptomatic. Rash is resolved. No intraoral swelling or difficulty breathing. Patient is requesting discharge home. We'll discharge home with short course of steroids. We'll also give EpiPen to use as needed for rest or distress related to allergic reactions.  Loren Raceravid Taryll Reichenberger, MD 06/07/14 [phone]

## 2014-06-07 NOTE — ED Notes (Signed)
MD at bedside. 

## 2014-06-07 NOTE — ED Notes (Signed)
Patient resting on stretcher, NAD. Respirations equal and unlabored, pts rash faded in color from when she first arrived, pt seems to be itching less as well.

## 2014-06-07 NOTE — ED Notes (Signed)
Pt in nad at this time, states skin rash feels much better. Respirations equal, unlabored.

## 2014-06-07 NOTE — ED Notes (Signed)
Pt states she went to the store, all of a sudden broke out in a burning, itching rash all over, started feeling sob. Pt does not know what she came into contact with, denies any new products or ingesting anything new. Pt tachypneic, stating she cannot breathe. Airway appears patent.

## 2014-06-07 NOTE — Discharge Instructions (Signed)

## 2014-06-07 NOTE — ED Notes (Signed)
Patient here with allergic reaction to unknown substance. Started about 20 mins ago. Diffuse rash all over body. Patient gasping for breath.

## 2015-03-20 ENCOUNTER — Encounter (HOSPITAL_COMMUNITY): Payer: Self-pay | Admitting: Emergency Medicine

## 2015-03-20 ENCOUNTER — Emergency Department (HOSPITAL_COMMUNITY)
Admission: EM | Admit: 2015-03-20 | Discharge: 2015-03-20 | Disposition: A | Discharge status: Home / Self Care | Payer: Medicaid Other | Attending: Emergency Medicine | Admitting: Emergency Medicine

## 2015-03-20 DIAGNOSIS — L509 Urticaria, unspecified: Secondary | ICD-10-CM

## 2015-03-20 DIAGNOSIS — R067 Sneezing: Secondary | ICD-10-CM | POA: Insufficient documentation

## 2015-03-20 DIAGNOSIS — R0981 Nasal congestion: Secondary | ICD-10-CM | POA: Diagnosis not present

## 2015-03-20 DIAGNOSIS — R21 Rash and other nonspecific skin eruption: Secondary | ICD-10-CM | POA: Diagnosis present

## 2015-03-20 MED ORDER — DIPHENHYDRAMINE HCL 25 MG PO TABS: 25.0000 mg | ORAL_TABLET | Freq: Four times a day (QID) | ORAL | Status: DC | PRN

## 2015-03-20 MED ORDER — EPINEPHRINE 0.3 MG/0.3ML IJ SOAJ: 0.3000 mg | Freq: Once | INTRAMUSCULAR | Status: DC | PRN

## 2015-03-20 MED ORDER — METHYLPREDNISOLONE SODIUM SUCC 125 MG IJ SOLR
125.0000 mg | Freq: Once | INTRAMUSCULAR | Status: AC
Start: 2015-03-20 — End: 2015-03-20
  Administered 2015-03-20: 125 mg via INTRAVENOUS

## 2015-03-20 MED ORDER — PREDNISONE 20 MG PO TABS: 40.0000 mg | ORAL_TABLET | Freq: Every day | ORAL | Status: DC

## 2015-03-20 MED ORDER — METHYLPREDNISOLONE SODIUM SUCC 125 MG IJ SOLR
INTRAMUSCULAR | Status: AC
  Filled 2015-03-20: qty 2

## 2015-03-20 MED ORDER — FAMOTIDINE IN NACL 20-0.9 MG/50ML-% IV SOLN
20.0000 mg | Freq: Once | INTRAVENOUS | Status: AC
  Administered 2015-03-20: 20 mg via INTRAVENOUS
  Filled 2015-03-20: qty 50

## 2015-03-20 MED ORDER — SODIUM CHLORIDE 0.9 % IV BOLUS (SEPSIS)
1000.0000 mL | Freq: Once | INTRAVENOUS | Status: AC
  Administered 2015-03-20: 1000 mL via INTRAVENOUS

## 2015-03-20 MED ORDER — DIPHENHYDRAMINE HCL 50 MG/ML IJ SOLN
50.0000 mg | Freq: Once | INTRAMUSCULAR | Status: AC
  Administered 2015-03-20: 50 mg via INTRAVENOUS

## 2015-03-20 MED ORDER — DIPHENHYDRAMINE HCL 50 MG/ML IJ SOLN
INTRAMUSCULAR | Status: AC
  Filled 2015-03-20: qty 1

## 2015-03-20 NOTE — ED Provider Notes (Signed)
CSN: [id number]     Arrival date & time 03/20/15  0152 History   First MD Initiated Contact with Patient 03/20/15 0203     Chief Complaint  Patient presents with  . Urticaria     (Consider location/radiation/quality/duration/timing/severity/associated sxs/prior Treatment) HPI Comments: 34 year old female with no significant past medical history presents to the emergency department for further evaluation of an urticarial rash which began approximately 45 minutes prior to arrival. Patient reports that she was lying in bed when she began to feel congested. She had some sneezing and then developed itching over her body. She states that she noticed a rash at this time and took 50 mg of oral Benadryl which provided no relief. Patient states that she has noted some swelling to her upper lip. She denies any shortness of breath, wheezing, stridor, drooling, difficulty swallowing, or nausea or vomiting. She denies any new topical contacts or food ingestions. She has a history of spontaneous onset of urticaria in December and states that today's symptoms are similar. She denies exposure to persons with similar rash. No recent travel.  Patient is a 34 y.o. female presenting with urticaria. The history is provided by the patient. No language interpreter was used.  Urticaria Associated symptoms include congestion and a rash. Pertinent negatives include no nausea or vomiting.    History reviewed. No pertinent past medical history. Past Surgical History  Procedure Laterality Date  . Tubal ligation     Family History  Problem Relation Age of Onset  . Hypertension Mother   . Aneurysm Mother   . Heart failure Mother   . Hypertension Father   . Gout Father    Social History  Substance Use Topics  . Smoking status: Never Smoker   . Smokeless tobacco: Never Used  . Alcohol Use: Yes     Comment: weekends   OB History    No data available      Review of Systems  HENT: Positive for congestion and  facial swelling (lip). Negative for drooling and trouble swallowing.   Respiratory: Negative for shortness of breath and wheezing.   Gastrointestinal: Negative for nausea and vomiting.  Skin: Positive for rash.  All other systems reviewed and are negative.   Allergies  Bee venom and Coconut flavor  Home Medications   Prior to Admission medications   Medication Sig Start Date End Date Taking? Authorizing Provider  diphenhydrAMINE (BENADRYL) 25 MG tablet Take 1 tablet (25 mg total) by mouth every 6 (six) hours as needed for itching (Rash). 03/20/15   Antony Madura, PA-C  EPINEPHrine 0.3 mg/0.3 mL IJ SOAJ injection Inject 0.3 mLs (0.3 mg total) into the muscle once as needed (For severe allergic reaction). 03/20/15   Antony Madura, PA-C  HYDROcodone-acetaminophen (NORCO/VICODIN) 5-325 MG per tablet Take 1 tablet by mouth every 6 (six) hours as needed for moderate pain. Patient not taking: Reported on 03/20/2015 05/03/13   Azalia Bilis, MD  ibuprofen (ADVIL,MOTRIN) 600 MG tablet Take 1 tablet (600 mg total) by mouth every 8 (eight) hours as needed. Patient not taking: Reported on 03/20/2015 05/03/13   Azalia Bilis, MD  predniSONE (DELTASONE) 20 MG tablet Take 2 tablets (40 mg total) by mouth daily. Take 40 mg by mouth daily for 3 days, then  by mouth daily for 3 days, then  daily for 3 days 03/20/15   Antony Madura, PA-C   BP 122/83 mmHg  Pulse 57  Temp(Src) 97.4 F (36.3 C)  Resp 18  Ht  (1.727 m)  Wt 160 lb (72.576 kg)  BMI 24.33 kg/m2  SpO2 100%  LMP 03/06/2015 (Approximate)   Physical Exam  Constitutional: She is oriented to person, place, and time. She appears well-developed and well-nourished. No distress.  Nontoxic/nonseptic appearing  HENT:  Head: Normocephalic and atraumatic.  Nose: Mucosal edema present. No rhinorrhea.  Mouth/Throat: Uvula is midline and mucous membranes are normal. No oropharyngeal exudate or posterior oropharyngeal edema.  Uvula midline. Patient  tolerating secretions without difficulty or drooling. Mild swelling to the upper lip. No stridor.  Eyes: Conjunctivae and EOM are normal. No scleral icterus.  Neck: Normal range of motion.  Cardiovascular: Normal rate, regular rhythm and intact distal pulses.   Pulmonary/Chest: Effort normal. No respiratory distress.  Respirations even and unlabored  Musculoskeletal: Normal range of motion.  Neurological: She is alert and oriented to person, place, and time. She exhibits normal muscle tone. Coordination normal.  Skin: Skin is warm and dry. Rash noted. She is not diaphoretic. No erythema. No pallor.  Diffuse urticarial, pruritic rash to body including trunk, back, and extremities.  Psychiatric: She has a normal mood and affect. Her behavior is normal.  Nursing note and vitals reviewed.   ED Course  Procedures (including critical care time) Labs Review Labs Reviewed - No data to display  Imaging Review No results found.   I have personally reviewed and evaluated these images and lab results as part of my medical decision-making.   EKG Interpretation None      0400 - Patient rechecked; urticaria has resolved. Lip swelling also has resolved. Patient states that she feels much better.  1610 - Patient more awake; now requesting d/c. MDM   Final diagnoses:  Urticaria    34 year old female presents to the emergency department for further evaluation of her urticaria. Patient denies any new soaps/lotions/topical contacts, or food ingestions inciting her symptoms. She denies contact with persons of similar rash. Patient has had no throat swelling or tongue swelling. No inability to swallow or drooling. No hypoxia or shortness of breath. Rash and upper lip swelling resolved with Benadryl, Pepcid, and Solu-Medrol.  Patient has been monitored in the emergency department for approximately 4 hours. She has had no recurrence of her symptoms. Will refer patient to an allergist given history of  same approximately one year ago. Return precautions discussed and provided. Patient agreeable to plan with no unaddressed concerns. Patient discharged in good condition.   Filed Vitals:   03/20/15 0330 03/20/15 0400 03/20/15 0430 03/20/15 0541  BP: 134/99 119/95 118/76 122/83  Pulse: 55 53 53 57  Temp:      Resp:    18  Height:      Weight:      SpO2: 100% 98% 96% 100%     Antony Madura, PA-C 03/20/15 [id number]  Derwood Kaplan, MD 03/21/15 [id number]

## 2015-03-20 NOTE — ED Notes (Signed)
Pt. reports generalized itchy rashes/hives onset this evening unrelieved by oral Benadryl , respirations unlabored / no oral swelling , denies fever or chills.

## 2015-03-20 NOTE — Discharge Instructions (Signed)
Hives Hives are itchy, red, swollen areas of the skin. They can vary in size and location on your body. Hives can come and go for hours or several days (acute hives) or for several weeks (chronic hives). Hives do not spread from person to person (noncontagious). They may get worse with scratching, exercise, and emotional stress. CAUSES   Allergic reaction to food, additives, or drugs.  Infections, including the common cold.  Illness, such as vasculitis, lupus, or thyroid disease.  Exposure to sunlight, heat, or cold.  Exercise.  Stress.  Contact with chemicals. SYMPTOMS   Red or white swollen patches on the skin. The patches may change size, shape, and location quickly and repeatedly.  Itching.  Swelling of the hands, feet, and face. This may occur if hives develop deeper in the skin. DIAGNOSIS  Your caregiver can usually tell what is wrong by performing a physical exam. Skin or blood tests may also be done to determine the cause of your hives. In some cases, the cause cannot be determined. TREATMENT  Mild cases usually get better with medicines such as antihistamines. Severe cases may require an emergency epinephrine injection. If the cause of your hives is known, treatment includes avoiding that trigger.  HOME CARE INSTRUCTIONS   Avoid causes that trigger your hives.  Take antihistamines as directed by your caregiver to reduce the severity of your hives. Non-sedating or low-sedating antihistamines are usually recommended. Do not drive while taking an antihistamine.  Take any other medicines prescribed for itching as directed by your caregiver.  Wear loose-fitting clothing.  Keep all follow-up appointments as directed by your caregiver. SEEK MEDICAL CARE IF:   You have persistent or severe itching that is not relieved with medicine.  You have painful or swollen joints. SEEK IMMEDIATE MEDICAL CARE IF:   You have a fever.  Your tongue or lips are swollen.  You have  trouble breathing or swallowing.  You feel tightness in the throat or chest.  You have abdominal pain. These problems may be the first sign of a life-threatening allergic reaction. Call your local emergency services (911 in [place] MAKE SURE YOU:   Understand these instructions.  Will watch your condition.  Will get help right away if you are not doing well or get worse.   This information is not intended to replace advice given to you by your health care provider. Make sure you discuss any questions you have with your health care provider.   Document Released: 05/27/2005 Document Revised: 06/01/2013 Document Reviewed: 08/20/2011 Elsevier Interactive Patient Education 2016 [employer]  

## 2015-03-20 NOTE — ED Notes (Signed)
Pt feeling relief from itching, gave pt warm blankets and resting.
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
## 2015-07-17 ENCOUNTER — Encounter (HOSPITAL_COMMUNITY): Payer: Self-pay | Admitting: *Deleted

## 2015-07-17 ENCOUNTER — Emergency Department (HOSPITAL_COMMUNITY): Payer: Medicaid Other

## 2015-07-17 ENCOUNTER — Emergency Department (HOSPITAL_COMMUNITY)
Admission: EM | Admit: 2015-07-17 | Discharge: 2015-07-17 | Disposition: A | Discharge status: Home / Self Care | Payer: Medicaid Other | Attending: Emergency Medicine | Admitting: Emergency Medicine

## 2015-07-17 DIAGNOSIS — N390 Urinary tract infection, site not specified: Secondary | ICD-10-CM | POA: Diagnosis not present

## 2015-07-17 DIAGNOSIS — B349 Viral infection, unspecified: Secondary | ICD-10-CM | POA: Diagnosis not present

## 2015-07-17 DIAGNOSIS — Z3202 Encounter for pregnancy test, result negative: Secondary | ICD-10-CM | POA: Diagnosis not present

## 2015-07-17 DIAGNOSIS — Z79899 Other long term (current) drug therapy: Secondary | ICD-10-CM | POA: Insufficient documentation

## 2015-07-17 DIAGNOSIS — R300 Dysuria: Secondary | ICD-10-CM | POA: Diagnosis present

## 2015-07-17 LAB — URINALYSIS, ROUTINE W REFLEX MICROSCOPIC
Bilirubin Urine: NEGATIVE
GLUCOSE, UA: NEGATIVE mg/dL
Ketones, ur: NEGATIVE mg/dL
Nitrite: NEGATIVE
PH: 6 (ref 5.0–8.0)
Protein, ur: NEGATIVE mg/dL
Specific Gravity, Urine: 1.018 (ref 1.005–1.030)

## 2015-07-17 LAB — POC URINE PREG, ED: Preg Test, Ur: NEGATIVE

## 2015-07-17 LAB — URINE MICROSCOPIC-ADD ON

## 2015-07-17 MED ORDER — CEPHALEXIN 500 MG PO CAPS: 500.0000 mg | ORAL_CAPSULE | Freq: Two times a day (BID) | ORAL | Status: DC

## 2015-07-17 NOTE — ED Notes (Signed)
Pt is in stable condition upon d/c and ambulates from ED. 

## 2015-07-17 NOTE — ED Provider Notes (Signed)
CSN: [id number]     Arrival date & time 07/17/15  1011 History   First MD Initiated Contact with Patient 07/17/15 1108     Chief Complaint  Patient presents with  . URI  . Dysuria   (Consider location/radiation/quality/duration/timing/severity/associated sxs/prior Treatment) HPI 35 y.o. female presents to the Emergency Department today complaining of cough/congestion as well as Dysuria since last night. Associated rhinorrhea, headache, congestion, and productive cough. No N/V/D. No fevers. No CP/SOB/ABD pain. Reports dysuria with no hematuria. Drank cranberry juice to symptom control. No vaginal pain/bleeding/discharge. No pain currently. No other symptoms noted.    History reviewed. No pertinent past medical history. Past Surgical History  Procedure Laterality Date  . Tubal ligation     Family History  Problem Relation Age of Onset  . Hypertension Mother   . Aneurysm Mother   . Heart failure Mother   . Hypertension Father   . Gout Father    Social History  Substance Use Topics  . Smoking status: Never Smoker   . Smokeless tobacco: Never Used  . Alcohol Use: Yes     Comment: weekends   OB History    No data available     Review of Systems ROS reviewed and all are negative for acute change except as noted in the HPI.  Allergies  Bee venom and Coconut flavor  Home Medications   Prior to Admission medications   Medication Sig Start Date End Date Taking? Authorizing Provider  diphenhydrAMINE (BENADRYL) 25 MG tablet Take 1 tablet (25 mg total) by mouth every 6 (six) hours as needed for itching (Rash). 03/20/15   Antony Madura, PA-C  EPINEPHrine 0.3 mg/0.3 mL IJ SOAJ injection Inject 0.3 mLs (0.3 mg total) into the muscle once as needed (For severe allergic reaction). 03/20/15   Antony Madura, PA-C  HYDROcodone-acetaminophen (NORCO/VICODIN) 5-325 MG per tablet Take 1 tablet by mouth every 6 (six) hours as needed for moderate pain. Patient not taking: Reported on 03/20/2015  05/03/13   Azalia Bilis, MD  ibuprofen (ADVIL,MOTRIN) 600 MG tablet Take 1 tablet (600 mg total) by mouth every 8 (eight) hours as needed. Patient not taking: Reported on 03/20/2015 05/03/13   Azalia Bilis, MD  predniSONE (DELTASONE) 20 MG tablet Take 2 tablets (40 mg total) by mouth daily. Take 40 mg by mouth daily for 3 days, then  by mouth daily for 3 days, then  daily for 3 days 03/20/15   Antony Madura, PA-C   BP 124/87 mmHg  Pulse 60  Temp(Src) 98.4 F (36.9 C) (Oral)  Resp 20  SpO2 100%  LMP 06/26/2015   Physical Exam  Constitutional: She is oriented to person, place, and time. She appears well-developed and well-nourished.  HENT:  Head: Normocephalic and atraumatic.  Right Ear: Tympanic membrane, external ear and ear canal normal.  Left Ear: Tympanic membrane, external ear and ear canal normal.  Nose: Nose normal.  Mouth/Throat: Uvula is midline, oropharynx is clear and moist and mucous membranes are normal.  Eyes: EOM are normal.  Cardiovascular: Normal rate, regular rhythm and normal heart sounds.   Pulmonary/Chest: Effort normal and breath sounds normal.  Abdominal: Soft. Bowel sounds are normal. There is no tenderness.  Musculoskeletal: Normal range of motion.  Neurological: She is alert and oriented to person, place, and time.  Skin: Skin is warm and dry.  Psychiatric: She has a normal mood and affect. Her behavior is normal. Thought content normal.  Nursing note and vitals reviewed.   ED Course  Procedures (  including critical care time) Labs Review Labs Reviewed  URINALYSIS, ROUTINE W REFLEX MICROSCOPIC (NOT AT [facility]) - Abnormal; Notable for the following:    APPearance CLOUDY (*)    Hgb urine dipstick TRACE (*)    Leukocytes, UA MODERATE (*)    All other components within normal limits  URINE MICROSCOPIC-ADD ON - Abnormal; Notable for the following:    Squamous Epithelial / LPF 6-30 (*)    Bacteria, UA MANY (*)    All other components within normal  limits  POC URINE PREG, ED    Imaging Review Dg Chest 2 View  07/17/2015  CLINICAL DATA:  Cough and short of breath EXAM: CHEST  2 VIEW COMPARISON:  05/03/2013 FINDINGS: The heart size and mediastinal contours are within normal limits. Both lungs are clear. The visualized skeletal structures are unremarkable. IMPRESSION: No active cardiopulmonary disease. Electronically Signed   By: Marlan Palau M.D.   On: 07/17/2015 12:43   I have personally reviewed and evaluated these images and lab results as part of my medical decision-making.   EKG Interpretation None      MDM  I have reviewed relevant laboratory values. I have reviewed relevant imaging studies.I have reviewed the relevant previous healthcare records. I obtained HPI from historian. Patient discussed with supervising physician  ED Course:  Assessment: 74y F presents with URI symptoms in addition to Dysuria. On exam, lungs CTA, Heart RRR. No Abd pain. Pt CXR negative for acute infiltrate. Afebrile. Patient's symptoms are consistent with URI, likely viral etiology. Urine showed Leuks/Bac. Will treat for UTI with Keflex. Patient is in no acute distress. Vital Signs are stable. Patient is able to ambulate. Patient able to tolerate PO.    Disposition/Plan:  DC Home Additional Verbal discharge instructions given and discussed with patient.  Pt Instructed to f/u with PCP in the next 48 hours for evaluation and treatment of symptoms. Return precautions given Pt acknowledges and agrees with plan  Supervising Physician Jerelyn Scott, MD   Final diagnoses:  UTI (lower urinary tract infection)  Viral syndrome    Audry Pili, PA-C 07/17/15 1246  Jerelyn Scott, MD 07/17/15 1250

## 2015-07-17 NOTE — ED Notes (Signed)
Pt reports recent cough and cold symptoms but also having pain with urination. No distress noted at triage.

## 2015-07-17 NOTE — ED Notes (Signed)
Patient transported to X-ray 

## 2015-07-17 NOTE — Discharge Instructions (Signed)
Please read and follow all provided instructions.  Your diagnoses today include:  1. UTI (lower urinary tract infection)     Tests performed today include:  Urine test - suggests that you have an infection in your bladder  Vital signs. See below for your results today.   Medications prescribed:   Keflex  Home care instructions:  Follow any educational materials contained in this packet.  Follow-up instructions: Please follow-up with your primary care provider in 3 days if symptoms are not resolved for further evaluation of your symptoms.  Return instructions:   Please return to the Emergency Department if you experience worsening symptoms.   Return with fever, worsening pain, persistent vomiting, worsening pain in your back.   Please return if you have any other emergent concerns.  Additional Information:  Your vital signs today were: BP 124/87 mmHg   Pulse 60   Temp(Src) 98.4 F (36.9 C) (Oral)   Resp 20   SpO2 100%   LMP 06/26/2015 If your blood pressure (BP) was elevated above 135/85 this visit, please have this repeated by your doctor within one month. --------------

## 2016-04-12 LAB — PROCEDURE REPORT - SCANNED: PAP SMEAR: NEGATIVE

## 2017-01-24 ENCOUNTER — Encounter (HOSPITAL_COMMUNITY): Payer: Self-pay

## 2017-01-24 ENCOUNTER — Emergency Department (HOSPITAL_COMMUNITY)
Admission: EM | Admit: 2017-01-24 | Discharge: 2017-01-25 | Disposition: A | Discharge status: Home / Self Care | Payer: Medicaid Other | Attending: Emergency Medicine | Admitting: Emergency Medicine

## 2017-01-24 ENCOUNTER — Emergency Department (HOSPITAL_COMMUNITY): Payer: Medicaid Other

## 2017-01-24 DIAGNOSIS — Y939 Activity, unspecified: Secondary | ICD-10-CM | POA: Insufficient documentation

## 2017-01-24 DIAGNOSIS — W228XXA Striking against or struck by other objects, initial encounter: Secondary | ICD-10-CM | POA: Insufficient documentation

## 2017-01-24 DIAGNOSIS — Z79899 Other long term (current) drug therapy: Secondary | ICD-10-CM | POA: Diagnosis not present

## 2017-01-24 DIAGNOSIS — S9782XA Crushing injury of left foot, initial encounter: Secondary | ICD-10-CM | POA: Diagnosis present

## 2017-01-24 DIAGNOSIS — Y929 Unspecified place or not applicable: Secondary | ICD-10-CM | POA: Insufficient documentation

## 2017-01-24 DIAGNOSIS — S9032XA Contusion of left foot, initial encounter: Secondary | ICD-10-CM | POA: Diagnosis not present

## 2017-01-24 DIAGNOSIS — Y999 Unspecified external cause status: Secondary | ICD-10-CM | POA: Diagnosis not present

## 2017-01-24 MED ORDER — IBUPROFEN 800 MG PO TABS
800.0000 mg | ORAL_TABLET | Freq: Once | ORAL | Status: AC
  Administered 2017-01-25: 800 mg via ORAL
  Filled 2017-01-24: qty 1

## 2017-01-24 MED ORDER — IBUPROFEN 600 MG PO TABS: 600.0000 mg | ORAL_TABLET | Freq: Four times a day (QID) | ORAL | 0 refills | Status: DC | PRN

## 2017-01-24 NOTE — ED Triage Notes (Signed)
Pt states that a shelf fell on her L foot, pain and swelling to top of foot. No deformity noted.

## 2017-01-24 NOTE — ED Notes (Signed)
Patient is A&Ox4.  No signs of distress noted.  Please see providers complete history and physical exam.  

## 2017-01-24 NOTE — ED Provider Notes (Signed)
[facility]-EMERGENCY DEPT Provider Note   CSN: [id number] Arrival date & time: 01/24/17  2110  By signing my name below, I, Angie Key, attest that this documentation has been prepared under the direction and in the presence of Angie Manrique K. Shaunak Kreis, PA-C. Electronically Signed: Deland Key, ED Scribe. 01/24/17. 11:49 PM.  History   Chief Complaint Chief Complaint  Patient presents with  . Foot Injury   The history is provided by the patient. No language interpreter was used.    HPI Comments: Angie Key is a 36 y.o. female who presents to the Emergency Department complaining of a sudden onset of gradually worsening, "stinging" left foot pain with associated swelling s/p ain injury that occurred at 8:30pm today. She states that she was moving a shelf, when it fell onto her foot. "Wiggling" her toes and ambulation exacerbates her pain. No medications tried. NKDA. The pt denies fever.   History reviewed. No pertinent past medical history.  There are no active problems to display for this patient.   Past Surgical History:  Procedure Laterality Date  . TUBAL LIGATION      OB History    No data available       Home Medications    Prior to Admission medications   Medication Sig Start Date End Date Taking? Authorizing Provider  cephALEXin (KEFLEX) 500 MG capsule Take 1 capsule (500 mg total) by mouth 2 (two) times daily. 07/17/15   Angie Pili, PA-C  diphenhydrAMINE (BENADRYL) 25 MG tablet Take 1 tablet (25 mg total) by mouth every 6 (six) hours as needed for itching (Rash). 03/20/15   Angie Madura, PA-C  EPINEPHrine 0.3 mg/0.3 mL IJ SOAJ injection Inject 0.3 mLs (0.3 mg total) into the muscle once as needed (For severe allergic reaction). 03/20/15   Angie Madura, PA-C  HYDROcodone-acetaminophen (NORCO/VICODIN) 5-325 MG per tablet Take 1 tablet by mouth every 6 (six) hours as needed for moderate pain. Patient not taking: Reported on 03/20/2015 05/03/13   Angie Bilis, MD    ibuprofen (ADVIL,MOTRIN) 600 MG tablet Take 1 tablet (600 mg total) by mouth every 8 (eight) hours as needed. Patient not taking: Reported on 03/20/2015 05/03/13   Angie Bilis, MD  predniSONE (DELTASONE) 20 MG tablet Take 2 tablets (40 mg total) by mouth daily. Take 40 mg by mouth daily for 3 days, then 20mg  by mouth daily for 3 days, then 10mg  daily for 3 days 03/20/15   Angie Madura, PA-C    Family History Family History  Problem Relation Age of Onset  . Hypertension Mother   . Aneurysm Mother   . Heart failure Mother   . Hypertension Father   . Gout Father     Social History Social History  Substance Use Topics  . Smoking status: Never Smoker  . Smokeless tobacco: Never Used  . Alcohol use Yes     Comment: weekends     Allergies   Bee venom and Coconut flavor   Review of Systems Review of Systems  Constitutional: Negative for fever.  Musculoskeletal: Positive for myalgias.     Physical Exam Updated Vital Signs BP 113/79 (BP Location: Left Arm)   Pulse 89   Temp 98.1 F (36.7 C) (Oral)   Resp 20   Ht 5\' 3"  (1.6 m)   Wt 155 lb (70.3 kg)   LMP 01/04/2017   SpO2 98%   BMI 27.46 kg/m   Physical Exam  Constitutional: She is oriented to person, place, and time. She appears well-developed and well-nourished.  HENT:  Head: Normocephalic.  Eyes: EOM are normal.  Neck: Normal range of motion.  Pulmonary/Chest: Effort normal.  Abdominal: She exhibits no distension.  Musculoskeletal: Normal range of motion. She exhibits tenderness.  Swelling dorsal aspect of left mid-foot. Good ROM of toes and ankle. Neurovascularly intact.  Neurological: She is alert and oriented to person, place, and time.  Psychiatric: She has a normal mood and affect.  Nursing note and vitals reviewed.    ED Treatments / Results   DIAGNOSTIC STUDIES: Oxygen Saturation is 98% on RA, normal by my interpretation.   COORDINATION OF CARE: 11:44 PM-Discussed next steps with pt. Pt  verbalized understanding and is agreeable with the plan.   Labs (all labs ordered are listed, but only abnormal results are displayed) Labs Reviewed - No data to display  EKG  EKG Interpretation None       Radiology Dg Foot Complete Left  Result Date: 01/24/2017 CLINICAL DATA:  Left foot pain after injury. Wooden drawer fell on top of foot. EXAM: LEFT FOOT - COMPLETE 3+ VIEW COMPARISON:  None. FINDINGS: There is no evidence of fracture or dislocation. There is no evidence of arthropathy or other focal bone abnormality. Mild soft tissue edema in the mid dorsum of the foot. IMPRESSION: Dorsal soft tissue edema.  No acute fracture. Electronically Signed   By: Angie Key M.D.   On: 01/24/2017 22:33    Procedures Procedures (including critical care time)  Medications Ordered in ED Medications - No data to display   Initial Impression / Assessment and Plan / ED Course  I have reviewed the triage vital signs and the nursing notes.  Pertinent labs & imaging results that were available during my care of the patient were reviewed by me and considered in my medical decision making (see chart for details).       Final Clinical Impressions(s) / ED Diagnoses   Final diagnoses:  Contusion of left foot, initial encounter    New Prescriptions New Prescriptions   IBUPROFEN (ADVIL,MOTRIN) 600 MG TABLET    Take 1 tablet (600 mg total) by mouth every 6 (six) hours as needed.   Ace wrap Post op shoe [address]    Elson Areas, [place] 01/25/17 0001    Angie Lyons, MD 01/25/17 [phone]

## 2017-01-24 NOTE — Discharge Instructions (Signed)
Return if any problems.

## 2017-01-25 NOTE — ED Notes (Signed)
PT states understanding of care given, follow up care, and medication prescribed. PT ambulated from ED to car with a steady gait. 

## 2017-01-28 ENCOUNTER — Encounter: Payer: Self-pay | Admitting: Sports Medicine

## 2017-01-28 ENCOUNTER — Ambulatory Visit (INDEPENDENT_AMBULATORY_CARE_PROVIDER_SITE_OTHER): Payer: Medicaid Other | Admitting: Sports Medicine

## 2017-01-28 VITALS — BP 105/72 | HR 81 | Resp 16 | Ht 64.0 in | Wt 155.0 lb

## 2017-01-28 DIAGNOSIS — S9032XA Contusion of left foot, initial encounter: Secondary | ICD-10-CM

## 2017-01-28 DIAGNOSIS — M25475 Effusion, left foot: Secondary | ICD-10-CM

## 2017-01-28 DIAGNOSIS — M79672 Pain in left foot: Secondary | ICD-10-CM | POA: Diagnosis not present

## 2017-01-28 DIAGNOSIS — M792 Neuralgia and neuritis, unspecified: Secondary | ICD-10-CM

## 2017-01-28 MED ORDER — IBUPROFEN 800 MG PO TABS: 600.0000 mg | ORAL_TABLET | Freq: Two times a day (BID) | ORAL | 0 refills | Status: DC | PRN

## 2017-01-28 MED ORDER — METHYLPREDNISOLONE 4 MG PO TBPK: ORAL_TABLET | ORAL | 0 refills | Status: DC

## 2017-01-28 NOTE — Progress Notes (Signed)
   Subjective:    Patient ID: Angie Key, female    DOB: 1981/02/16, 36 y.o.   MRN: [id number]  HPI    Review of Systems  All other systems reviewed and are negative.      Objective:   Physical Exam        Assessment & Plan:

## 2017-01-28 NOTE — Progress Notes (Signed)
Subjective: Angie Key is a 36 y.o. female patient who presents to office for evaluation of left foot pain. Patient complains of progressive pain especially over the last few days in the left foot at the top after a shelf. He her foot when she was shopping in a store on 01-24-17. Reports that there is tingling and burning pain in the top of her foot also with pain with direct touch to the top and a variable swelling. States that she has been using a postoperative shoe as given by the ER and Ace wrap, which still has not completely help resolve her pain.Patient denies any other pedal complaints.   There are no active problems to display for this patient.   Current Outpatient Prescriptions on File Prior to Visit  Medication Sig Dispense Refill  . EPINEPHrine 0.3 mg/0.3 mL IJ SOAJ injection Inject 0.3 mLs (0.3 mg total) into the muscle once as needed (For severe allergic reaction). 1 Device 0   No current facility-administered medications on file prior to visit.     Allergies  Allergen Reactions  . Bee Venom Swelling  . Coconut Flavor Swelling    Objective:  General: Alert and oriented x3 in no acute distress  Dermatology: No open lesions bilateral lower extremities, no webspace macerations,Faint  Ecchymosis to the dorsum of the left foot, with mild soft tissue swelling, all nails x 10 are well manicured.  Vascular: Dorsalis Pedis and Posterior Tibial pedal pulses palpable, Capillary Fill Time 3 seconds,(+) pedal hair growth bilateral, no edema bilateral lower extremities, Temperature gradient within normal limits.  Neurology: Gross sensation intact via light touch bilateral. (- )Tinels sign bilateral. Subjective tingling to the top of the left foot.   Musculoskeletal: Mild tenderness with palpation at dorsal midfoot on left. There is mild tenderness to medial and lateral ankle on left. No pain with calf compression bilateral. Strength within normal limits in all groups bilateral However,  there is mild guarding on left due to pain.   Gait: Antalgic gait  Assessment and Plan: Problem List Items Addressed This Visit    None    Visit Diagnoses    Contusion of left foot, initial encounter    -  Primary   Relevant Medications   ibuprofen (ADVIL,MOTRIN) 800 MG tablet   methylPREDNISolone (MEDROL DOSEPAK) 4 MG TBPK tablet   Swelling of foot joint, left       Relevant Medications   methylPREDNISolone (MEDROL DOSEPAK) 4 MG TBPK tablet   Neuritis       Relevant Medications   methylPREDNISolone (MEDROL DOSEPAK) 4 MG TBPK tablet   Left foot pain           -Complete examination performed -Xrays reviewed From, ER with no acute findings  -Discussed treatement options For contusion injury  -RxMotrin 800 mg and steroid dose pack to take as instructed  -Applied Compressive Unna boot bandage to left foot with instructions to keep in place for 5 days and to ambulate with protective postoperative shoe until next office visit  -Recommend [employer] protocol -Work note given. Advised patient light duty with periods of rest. No excessive standing or walking beyond 20 minutes at a time -Patient to return to officin 2 weeks or sooner if condition worsens.  Asencion Islam, DPM

## 2017-01-28 NOTE — Progress Notes (Signed)
at

## 2017-01-28 NOTE — Patient Instructions (Signed)

## 2017-01-30 ENCOUNTER — Telehealth: Payer: Self-pay | Admitting: Sports Medicine

## 2017-01-30 ENCOUNTER — Ambulatory Visit: Payer: Medicaid Other

## 2017-01-30 DIAGNOSIS — S9032XA Contusion of left foot, initial encounter: Secondary | ICD-10-CM

## 2017-01-30 DIAGNOSIS — M25475 Effusion, left foot: Secondary | ICD-10-CM

## 2017-01-30 NOTE — Progress Notes (Signed)
Re-applied [employer], patient stated that it was comfortable. She is to keep her follow upappt with Dr Marylene Land

## 2017-01-30 NOTE — Telephone Encounter (Signed)
Pt called again and spoke with Pasty Arch, and informed me of pt's original call this morning. I spoke with Tomasa Hose, RN and she states have pt come in today at 11:30am. Pasty Arch informed pt and pt agreed.

## 2017-01-30 NOTE — Telephone Encounter (Signed)
I saw Dr. Marylene Land on Tuesday and they put this soft wrap on my foot. I don't know if it was wrapped too tight but it kept me up almost all night long last night. I don't know if my foot swell and that's what caused it. I ended up having to cut it off because it was causing a numbing sensation as well. I don't know if that was the right thing to do. I was wondering if I could come in today to have it re-wrapped but not as tightly.

## 2017-02-04 ENCOUNTER — Ambulatory Visit (INDEPENDENT_AMBULATORY_CARE_PROVIDER_SITE_OTHER): Payer: Medicaid Other | Admitting: Sports Medicine

## 2017-02-04 ENCOUNTER — Telehealth: Payer: Self-pay | Admitting: *Deleted

## 2017-02-04 ENCOUNTER — Encounter: Payer: Self-pay | Admitting: Sports Medicine

## 2017-02-04 DIAGNOSIS — M792 Neuralgia and neuritis, unspecified: Secondary | ICD-10-CM

## 2017-02-04 DIAGNOSIS — S92902A Unspecified fracture of left foot, initial encounter for closed fracture: Secondary | ICD-10-CM

## 2017-02-04 DIAGNOSIS — S9032XA Contusion of left foot, initial encounter: Secondary | ICD-10-CM

## 2017-02-04 DIAGNOSIS — M25475 Effusion, left foot: Secondary | ICD-10-CM

## 2017-02-04 DIAGNOSIS — M79672 Pain in left foot: Secondary | ICD-10-CM

## 2017-02-04 NOTE — Progress Notes (Signed)
Subjective: Angie Key is a 36 y.o. female patient who returns to office for follow-up evaluation of left foot pain. Patient was able to wear Unna boot with some improvement in symptoms however, still hurts when she is walking and standing long periods of time. States that she had to have the [employer] rewrapped because of numbness to toes. Patient denies any other pedal complaints.   There are no active problems to display for this patient.   Current Outpatient Prescriptions on File Prior to Visit  Medication Sig Dispense Refill  . EPINEPHrine 0.3 mg/0.3 mL IJ SOAJ injection Inject 0.3 mLs (0.3 mg total) into the muscle once as needed (For severe allergic reaction). 1 Device 0  . ibuprofen (ADVIL,MOTRIN) 800 MG tablet Take 1 tablet (800 mg total) by mouth 2 (two) times daily as needed. 28 tablet 0  . methylPREDNISolone (MEDROL DOSEPAK) 4 MG TBPK tablet Take as instructed 21 tablet 0   No current facility-administered medications on file prior to visit.     Allergies  Allergen Reactions  . Bee Venom Swelling  . Coconut Flavor Swelling    Objective:  General: Alert and oriented x3 in no acute distress  Dermatology: No open lesions bilateral lower extremities, no webspace macerations,Faint  Ecchymosis to the dorsum of the left foot, with mild soft tissue swelling, all nails x 10 are well manicured.  Vascular: Dorsalis Pedis and Posterior Tibial pedal pulses palpable, Capillary Fill Time 3 seconds,(+) pedal hair growth bilateral, no edema bilateral lower extremities, Temperature gradient within normal limits.  Neurology: Gross sensation intact via light touch bilateral. (- )Tinels sign bilateral. Subjective tingling to the top of the left foot.   Musculoskeletal: Mild tenderness with palpation at dorsal midfoot on left. There is mild tenderness to medial and lateral ankle on left. No pain with calf compression bilateral. Strength within normal limits in all groups bilateral However,  there is mild guarding on left due to pain.   Gait: Antalgic gait  Assessment and Plan: Problem List Items Addressed This Visit    None    Visit Diagnoses    Contusion of left foot, initial encounter    -  Primary   Swelling of foot joint, left       Neuritis       Left foot pain           -Complete examination performed -Discussed Continued care for contusion injury  -Ordered MRI for further evaluation, left foot pain after contusion injury -Continue with Motrin 800 mg twice a day as needed -Recommend patient to continue with Ace compression during the day to assist with edema control and postoperative shoe -Recommend [place] protocol -Continue with light duty at work -Patient to return to office after MRI or sooner if condition worsens.  Asencion Islam, DPM

## 2017-02-04 NOTE — Telephone Encounter (Addendum)
-----   Message from [place], [place] sent at 02/04/2017 10:32 AM EDT ----- Regarding: MRI left foot Contusion injury to left foot 8-17 with pain swelling and numbness.mri. 02/05/2017-[facility] - Medicaid required more clinicals prior to pre-certing MRI. Faxed to [facility], clinicals, x-ray report of 01/24/2017 and demographics.02/19/2017-[facility] denied Left foot MRI, due to not enough conservative care, I informed Dr. Marylene Land.

## 2017-02-14 ENCOUNTER — Ambulatory Visit
Admission: RE | Admit: 2017-02-14 | Discharge: 2017-02-14 | Disposition: A | Discharge status: Home / Self Care | Payer: Self-pay | Source: Ambulatory Visit | Attending: Sports Medicine | Admitting: Sports Medicine

## 2017-02-14 DIAGNOSIS — S92902A Unspecified fracture of left foot, initial encounter for closed fracture: Secondary | ICD-10-CM

## 2017-02-18 ENCOUNTER — Ambulatory Visit (INDEPENDENT_AMBULATORY_CARE_PROVIDER_SITE_OTHER): Payer: Medicaid Other | Admitting: Sports Medicine

## 2017-02-18 ENCOUNTER — Encounter: Payer: Self-pay | Admitting: Sports Medicine

## 2017-02-18 ENCOUNTER — Ambulatory Visit: Payer: Medicaid Other | Admitting: Sports Medicine

## 2017-02-18 DIAGNOSIS — M792 Neuralgia and neuritis, unspecified: Secondary | ICD-10-CM

## 2017-02-18 DIAGNOSIS — M79672 Pain in left foot: Secondary | ICD-10-CM

## 2017-02-18 DIAGNOSIS — M7752 Other enthesopathy of left foot: Secondary | ICD-10-CM

## 2017-02-18 DIAGNOSIS — M25475 Effusion, left foot: Secondary | ICD-10-CM

## 2017-02-18 DIAGNOSIS — M7751 Other enthesopathy of right foot: Secondary | ICD-10-CM | POA: Diagnosis not present

## 2017-02-18 DIAGNOSIS — M779 Enthesopathy, unspecified: Principal | ICD-10-CM

## 2017-02-18 DIAGNOSIS — M778 Other enthesopathies, not elsewhere classified: Secondary | ICD-10-CM

## 2017-02-18 DIAGNOSIS — S9032XD Contusion of left foot, subsequent encounter: Secondary | ICD-10-CM

## 2017-02-18 MED ORDER — TRIAMCINOLONE ACETONIDE 10 MG/ML IJ SUSP: 10.0000 mg | Freq: Once | INTRAMUSCULAR | Status: AC

## 2017-02-18 NOTE — Progress Notes (Signed)
Subjective: Angie Key is a 36 y.o. female patient who returns to office for follow-up evaluation of left foot pain. Patient states that she had her MRI and that the pain is better however still hurts over the bruise at the top of the left foot when touched. Patient is here for MRI results. Patient denies any other pedal complaints.   There are no active problems to display for this patient.   Current Outpatient Prescriptions on File Prior to Visit  Medication Sig Dispense Refill  . EPINEPHrine 0.3 mg/0.3 mL IJ SOAJ injection Inject 0.3 mLs (0.3 mg total) into the muscle once as needed (For severe allergic reaction). 1 Device 0  . ibuprofen (ADVIL,MOTRIN) 800 MG tablet Take 1 tablet (800 mg total) by mouth 2 (two) times daily as needed. 28 tablet 0   No current facility-administered medications on file prior to visit.     Allergies  Allergen Reactions  . Bee Venom Swelling  . Coconut Flavor Swelling    Objective:  General: Alert and oriented x3 in no acute distress  Dermatology: No open lesions bilateral lower extremities, no webspace macerations,Faint  Ecchymosis to the dorsum of the left foot, with mild soft tissue swelling, all nails x 10 are well manicured.  Vascular: Dorsalis Pedis and Posterior Tibial pedal pulses palpable, Capillary Fill Time 3 seconds,(+) pedal hair growth bilateral, no edema bilateral lower extremities, Temperature gradient within normal limits.  Neurology: Gross sensation intact via light touch bilateral. (- )Tinels sign bilateral. Subjective tingling to the top of the left foot.   Musculoskeletal: Mild tenderness with palpation at dorsal midfoot on left. There is no tenderness to medial and lateral ankle on left. No pain with calf compression bilateral. Strength within normal limits in all groups bilateral However, there is mild guarding on left due to pain.   Gait: Antalgic gait  MRI- Negative  Assessment and Plan: Problem List Items Addressed This  Visit    None    Visit Diagnoses    Capsulitis of left foot    -  Primary   Relevant Medications   triamcinolone acetonide (KENALOG) 10 MG/ML injection 10 mg (Start on 02/18/2017  3:30 PM)   Contusion of left foot, subsequent encounter       Swelling of foot joint, left       Neuritis       Left foot pain           -Complete examination performed -Discussed Continued care for contusion injury  -MRI negative -After oral consent and aseptic prep, injected a mixture containing 1 ml of 2%  plain lidocaine, 1 ml 0.5% plain marcaine, 0.5 ml of kenalog 10 and 0.5 ml of dexamethasone phosphate into left dorsal midtarsal joint at level of midfoot at area of contusion without complication. Post-injection care discussed with patient.  -Continue with Motrin 800 mg twice a day as needed -Recommend patient to continue with Ace compression during the day to assist with edema control and postoperative shoe as needed  -Recommend [facility] protocol -Continue with light duty at work -Patient to return to office for 1 month for follow up evaluation or sooner if condition worsens.  Asencion Islamitorya Luise Yamamoto, DPM

## 2017-03-17 ENCOUNTER — Telehealth: Payer: Self-pay | Admitting: Sports Medicine

## 2017-03-17 NOTE — Telephone Encounter (Signed)
I was calling to see if anyone has requested medical records for me. If you could give me a call back at [phone].

## 2017-03-17 NOTE — Telephone Encounter (Signed)
Called pt back and let her know that I faxed her requested records to [facility] this past Wednesday 03 October from her initial visit - current. I also told the pt that once Dr. Marylene Land dictates her office visit from her appointment scheduled tomorrow Tuesday 09 October that I would get that faxed to [facility] as well. Pt then wanted to confirm her appointment time of 9:15 am.

## 2017-03-18 ENCOUNTER — Encounter: Payer: Self-pay | Admitting: Sports Medicine

## 2017-03-18 ENCOUNTER — Ambulatory Visit (INDEPENDENT_AMBULATORY_CARE_PROVIDER_SITE_OTHER): Payer: Medicaid Other | Admitting: Sports Medicine

## 2017-03-18 DIAGNOSIS — M7752 Other enthesopathy of left foot: Secondary | ICD-10-CM

## 2017-03-18 DIAGNOSIS — M778 Other enthesopathies, not elsewhere classified: Secondary | ICD-10-CM

## 2017-03-18 DIAGNOSIS — M792 Neuralgia and neuritis, unspecified: Secondary | ICD-10-CM | POA: Diagnosis not present

## 2017-03-18 DIAGNOSIS — M79672 Pain in left foot: Secondary | ICD-10-CM | POA: Diagnosis not present

## 2017-03-18 DIAGNOSIS — M25475 Effusion, left foot: Secondary | ICD-10-CM | POA: Diagnosis not present

## 2017-03-18 DIAGNOSIS — S9032XD Contusion of left foot, subsequent encounter: Secondary | ICD-10-CM | POA: Diagnosis not present

## 2017-03-18 DIAGNOSIS — M779 Enthesopathy, unspecified: Principal | ICD-10-CM

## 2017-03-18 NOTE — Progress Notes (Signed)
Subjective: Angie Key is a 36 y.o. female patient who returns to office for follow-up evaluation of left foot pain. Patient states that the injection really helped. Has a little pain after being on her feet at work >6 hours but otherwise swelling is better. States that there is a little bit of brusing still but otherwise doing good. Patient denies any other pedal complaints.   There are no active problems to display for this patient.   Current Outpatient Prescriptions on File Prior to Visit  Medication Sig Dispense Refill  . EPINEPHrine 0.3 mg/0.3 mL IJ SOAJ injection Inject 0.3 mLs (0.3 mg total) into the muscle once as needed (For severe allergic reaction). 1 Device 0  . ibuprofen (ADVIL,MOTRIN) 800 MG tablet Take 1 tablet (800 mg total) by mouth 2 (two) times daily as needed. 28 tablet 0   Current Facility-Administered Medications on File Prior to Visit  Medication Dose Route Frequency Provider Last Rate Last Dose  . triamcinolone acetonide (KENALOG) 10 MG/ML injection 10 mg  10 mg Other Once Asencion Islam, DPM        Allergies  Allergen Reactions  . Bee Venom Swelling  . Coconut Flavor Swelling    Objective:  General: Alert and oriented x3 in no acute distress  Dermatology: No open lesions bilateral lower extremities, no webspace macerations,Faint  Ecchymosis to the dorsum of the left foot, with mild soft tissue swelling, all nails x 10 are well manicured.  Vascular: Dorsalis Pedis and Posterior Tibial pedal pulses palpable, Capillary Fill Time 3 seconds,(+) pedal hair growth bilateral, no edema bilateral lower extremities, Temperature gradient within normal limits.  Neurology: Gross sensation intact via light touch bilateral. (- )Tinels sign bilateral. Subjective tingling to the top of the left foot.   Musculoskeletal: No tenderness with palpation at dorsal midfoot on left. There is no tenderness to medial and lateral ankle on left. No pain with calf compression bilateral.  Strength within normal limits in all groups bilateral.  Assessment and Plan: Problem List Items Addressed This Visit    None    Visit Diagnoses    Capsulitis of left foot    -  Primary   Contusion of left foot, subsequent encounter       Swelling of foot joint, left       Neuritis       Left foot pain           -Complete examination performed -Discussed Continued care for contusion injury  -Continue with Motrin 800 mg twice a day as needed -Recommend patient to continue with Ace compression during the day to assist with edema control  -Recommend good supportive shoes  -Recommend Woodson protocol -Resume normal duty at work  -Patient to return to office as needed or sooner if condition worsens.  Asencion Islam, DPM

## 2017-04-01 ENCOUNTER — Ambulatory Visit (INDEPENDENT_AMBULATORY_CARE_PROVIDER_SITE_OTHER): Payer: Medicaid Other | Admitting: Sports Medicine

## 2017-04-01 ENCOUNTER — Encounter: Payer: Self-pay | Admitting: Sports Medicine

## 2017-04-01 DIAGNOSIS — M7752 Other enthesopathy of left foot: Secondary | ICD-10-CM

## 2017-04-01 DIAGNOSIS — M79672 Pain in left foot: Secondary | ICD-10-CM

## 2017-04-01 DIAGNOSIS — M7750 Other enthesopathy of unspecified foot: Secondary | ICD-10-CM

## 2017-04-01 DIAGNOSIS — M779 Enthesopathy, unspecified: Secondary | ICD-10-CM

## 2017-04-01 DIAGNOSIS — M778 Other enthesopathies, not elsewhere classified: Secondary | ICD-10-CM

## 2017-04-01 MED ORDER — TRIAMCINOLONE ACETONIDE 10 MG/ML IJ SUSP: 10.0000 mg | Freq: Once | INTRAMUSCULAR | Status: AC

## 2017-04-01 NOTE — Progress Notes (Signed)
Subjective: Angie Key is a 36 y.o. female patient who returns to office for follow-up evaluation of left foot pain. Patient states that she has pain at her ankle now that is sharp, started hurt once last week then again on yesterday. States that there is a little bit of brusing still but otherwise doing good at the top of her foot. Patient denies any other pedal complaints.   There are no active problems to display for this patient.   Current Outpatient Prescriptions on File Prior to Visit  Medication Sig Dispense Refill  . EPINEPHrine 0.3 mg/0.3 mL IJ SOAJ injection Inject 0.3 mLs (0.3 mg total) into the muscle once as needed (For severe allergic reaction). 1 Device 0  . ibuprofen (ADVIL,MOTRIN) 800 MG tablet Take 1 tablet (800 mg total) by mouth 2 (two) times daily as needed. 28 tablet 0   Current Facility-Administered Medications on File Prior to Visit  Medication Dose Route Frequency Provider Last Rate Last Dose  . triamcinolone acetonide (KENALOG) 10 MG/ML injection 10 mg  10 mg Other Once Asencion IslamStover, Akyah Lagrange, DPM        Allergies  Allergen Reactions  . Bee Venom Swelling  . Coconut Flavor Swelling    Objective:  General: Alert and oriented x3 in no acute distress  Dermatology: No open lesions bilateral lower extremities, no webspace macerations,Faint  Ecchymosis to the dorsum of the left foot, with mild soft tissue swelling, all nails x 10 are well manicured.  Vascular: Dorsalis Pedis and Posterior Tibial pedal pulses palpable, Capillary Fill Time 3 seconds,(+) pedal hair growth bilateral, no edema bilateral lower extremities, Temperature gradient within normal limits.  Neurology: Gross sensation intact via light touch bilateral. (- )Tinels sign bilateral. Subjective tingling to the top of the left foot.   Musculoskeletal: There is tenderness to posterior tibial tendon secondary to compensation. No tenderness with palpation at dorsal midfoot on left. There is no tenderness to  lateral ankle on left. No pain with calf compression bilateral. Strength within normal limits in all groups bilateral.  Assessment and Plan: Problem List Items Addressed This Visit    None    Visit Diagnoses    Tendonitis of foot    -  Primary   Relevant Medications   triamcinolone acetonide (KENALOG) 10 MG/ML injection 10 mg   Capsulitis of left foot       Relevant Medications   triamcinolone acetonide (KENALOG) 10 MG/ML injection 10 mg   Left foot pain       Relevant Medications   triamcinolone acetonide (KENALOG) 10 MG/ML injection 10 mg       -Complete examination performed -Discussed continued care for contusion injury and compensation tendonitis  -After oral consent and aseptic prep, injected a mixture containing 1 ml of 2%  plain lidocaine, 1 ml 0.5% plain marcaine, 0.5 ml of kenalog 10 and 0.5 ml of dexamethasone phosphate at PT tendon course without complication. Post-injection care discussed with patient.  -Applied fascial strapping on left and advised patient to keep in place for 5 days and if works well to get OTC insoles/airplus or superfeet  -Continue with Motrin 800 mg twice a day as needed -Recommend patient to continue with Ace compression during the day to assist with edema control  -Recommend good supportive shoes  -Recommend Mouser protocol -Resume normal duty at work after tomorrow -Patient to return to office as needed or sooner if condition worsens.  Asencion Islamitorya Lerlene Treadwell, DPM

## 2017-04-01 NOTE — Patient Instructions (Signed)
Airplus insoles at [employer] Ankle support at [employer]   For tennis shoes recommend:  Anne ShutterBrooks Beast Ascis New balance Saucony Can be purchased at [employer] or [employer]  Vionic  SAS Can be purchased at [employer] or [employer]   For work shoes recommend: [employer] boots  Can be purchased at a variety of places or Scient[employer], product[employer]process development[employer] Market   For casual shoes recommend: Vionic  Can be purchased at [employer] or [employer]

## 2017-04-11 ENCOUNTER — Telehealth: Payer: Self-pay | Admitting: *Deleted

## 2017-04-22 NOTE — Telephone Encounter (Signed)
Pt presents to office, stating her MRI was denied, and she had it performed anyway. Pt states she contacted her insurance and the said to fill our a new request form for Case# [id number], and send the last 6 weeks of clinicals with the previous clinicals and the form. 04/22/2017-Faxed required [employer] [facility] Request Fax Form, clinicals prior to today's date pertaining to left foot, and demographics to [employer].

## 2017-04-28 ENCOUNTER — Telehealth: Payer: Self-pay | Admitting: *Deleted

## 2017-04-28 DIAGNOSIS — G5752 Tarsal tunnel syndrome, left lower limb: Secondary | ICD-10-CM

## 2017-04-28 NOTE — Telephone Encounter (Signed)
Pt states she spoke with Medicaid and the paper work was sent in for a leg not her foot. I reviewed my orders in Imaging, results and on the 04/22/2017 paper work and the orders were put in for the left foot [id number].

## 2017-04-29 ENCOUNTER — Encounter: Payer: Self-pay | Admitting: Neurology

## 2017-04-29 ENCOUNTER — Encounter: Payer: Self-pay | Admitting: Sports Medicine

## 2017-04-29 ENCOUNTER — Ambulatory Visit: Payer: Medicaid Other | Admitting: Sports Medicine

## 2017-04-29 ENCOUNTER — Other Ambulatory Visit: Payer: Self-pay | Admitting: *Deleted

## 2017-04-29 DIAGNOSIS — G5752 Tarsal tunnel syndrome, left lower limb: Secondary | ICD-10-CM | POA: Diagnosis not present

## 2017-04-29 DIAGNOSIS — M792 Neuralgia and neuritis, unspecified: Secondary | ICD-10-CM

## 2017-04-29 DIAGNOSIS — M7750 Other enthesopathy of unspecified foot: Secondary | ICD-10-CM | POA: Diagnosis not present

## 2017-04-29 DIAGNOSIS — M778 Other enthesopathies, not elsewhere classified: Secondary | ICD-10-CM

## 2017-04-29 DIAGNOSIS — M7752 Other enthesopathy of left foot: Secondary | ICD-10-CM | POA: Diagnosis not present

## 2017-04-29 DIAGNOSIS — M779 Enthesopathy, unspecified: Secondary | ICD-10-CM

## 2017-04-29 DIAGNOSIS — M79672 Pain in left foot: Secondary | ICD-10-CM | POA: Diagnosis not present

## 2017-04-29 MED ORDER — DICLOFENAC SODIUM 75 MG PO TBEC: 75.0000 mg | DELAYED_RELEASE_TABLET | Freq: Two times a day (BID) | ORAL | 0 refills | Status: DC

## 2017-04-29 NOTE — Telephone Encounter (Signed)
[facility] called [facility] and [facility] said everything was sent as a leg. I reviewed Dr.Stover's orders of 02/04/2017 stated left foot, orders were put in as left foot [id number], [employer] Case Summary was ordered as [id number], and results of 02/14/2017 left foot.

## 2017-04-29 NOTE — Telephone Encounter (Signed)
Pt in office to for appt with Dr. Marylene LandStover, presents to my office. Pt [facility] that said something did not match up. I asked pt for the name of the person she spoke with and she told me she spoke with an Intake Supervisor at [phone].

## 2017-04-29 NOTE — Telephone Encounter (Signed)
Medicaid [place] Tracks - Reference# [id number], representative states they do not have a case for this Medicaid ID recipient, contact [employer].

## 2017-04-29 NOTE — Progress Notes (Signed)
Subjective: Angie Key is a 36 y.o. female patient who returns to office for follow-up evaluation of left foot pain. Patient states that she has pain at the side of ankle and top of foot, 7/10. Tried ibuprofen, taping and OTC insoles which help some but still having pain. Reports previous injection helped for a couple of weeks. Patient denies any other pedal complaints.   There are no active problems to display for this patient.   Current Outpatient Medications on File Prior to Visit  Medication Sig Dispense Refill  . EPINEPHrine 0.3 mg/0.3 mL IJ SOAJ injection Inject 0.3 mLs (0.3 mg total) into the muscle once as needed (For severe allergic reaction). 1 Device 0  . ibuprofen (ADVIL,MOTRIN) 800 MG tablet Take 1 tablet (800 mg total) by mouth 2 (two) times daily as needed. 28 tablet 0   Current Facility-Administered Medications on File Prior to Visit  Medication Dose Route Frequency Provider Last Rate Last Dose  . triamcinolone acetonide (KENALOG) 10 MG/ML injection 10 mg  10 mg Other Once Asencion IslamStover, Jeremiah Curci, DPM      . triamcinolone acetonide (KENALOG) 10 MG/ML injection 10 mg  10 mg Other Once Asencion IslamStover, Chao Blazejewski, DPM        Allergies  Allergen Reactions  . Bee Venom Swelling  . Coconut Flavor Swelling    Objective:  General: Alert and oriented x3 in no acute distress  Dermatology: No open lesions bilateral lower extremities, no webspace macerations,Resolved ecchymosis to the dorsum of the left foot, minimal soft tissue swelling dorsal left foot, all nails x 10 are well manicured.  Vascular: Dorsalis Pedis and Posterior Tibial pedal pulses palpable, Capillary Fill Time 3 seconds,(+) pedal hair growth bilateral, no edema bilateral lower extremities, Temperature gradient within normal limits.  Neurology: Michaell CowingGross sensation intact via light touch bilateral. (- )Tinels sign bilateral. Subjective tingling and sharp pain to the top of the left foot at Superfical peroneal nerve course and along  posterior tibial nerve course on left.   Musculoskeletal: There is tenderness to posterior tibial tendon secondary to compensation. Mild tenderness with palpation at dorsal midfoot on left. There is no tenderness to lateral ankle on left. No pain with calf compression bilateral. Strength within normal limits in all groups bilateral.  Assessment and Plan: Problem List Items Addressed This Visit    None    Visit Diagnoses    Tendonitis of foot    -  Primary   Capsulitis of left foot       Left foot pain       Neuritis       Tarsal tunnel syndrome of left side         -Complete examination performed -Discussed continued care for neuritis with compensation tendonitis  -Rx Diclofenac to replace Motrin -Recommend OTC Aspercreme or topical pain patch -Rx NCV/EMG for possible nerve entarpement vs tarsal tunnel syndrome  -Recommend good supportive shoes and insoles  -Recommend Bahr protocol -Continue normal duty at work -Patient to return to office after nerve studies or sooner if condition worsens.  Asencion Islamitorya Noemi Bellissimo, DPM

## 2017-04-29 NOTE — Telephone Encounter (Signed)
-----   Message from Garrett Parkitorya Stover, [place] sent at 04/29/2017  8:57 AM EST ----- Regarding: NCV/EMG Left foot pain sharp and tingling after a rack fell on her foot R/o tarsal tunnel vs nerve entrapment  -Dr. Marylene LandStover

## 2017-04-29 NOTE — Telephone Encounter (Signed)
[employer] - Kyla N. (1:53pm) states after 5 days, [place] Medicaid will have to have an appeal for the original case/ticket #.

## 2017-04-29 NOTE — Telephone Encounter (Signed)
Faxed orders to [facility]. 

## 2017-05-03 ENCOUNTER — Encounter (HOSPITAL_COMMUNITY): Payer: Self-pay

## 2017-05-03 ENCOUNTER — Emergency Department (HOSPITAL_COMMUNITY)
Admission: EM | Admit: 2017-05-03 | Discharge: 2017-05-03 | Disposition: A | Discharge status: Home / Self Care | Payer: Medicaid Other | Attending: Emergency Medicine | Admitting: Emergency Medicine

## 2017-05-03 DIAGNOSIS — H6692 Otitis media, unspecified, left ear: Secondary | ICD-10-CM

## 2017-05-03 DIAGNOSIS — H9202 Otalgia, left ear: Secondary | ICD-10-CM | POA: Diagnosis present

## 2017-05-03 MED ORDER — IBUPROFEN 400 MG PO TABS
600.0000 mg | ORAL_TABLET | Freq: Once | ORAL | Status: AC
  Administered 2017-05-03: 600 mg via ORAL
  Filled 2017-05-03: qty 1

## 2017-05-03 MED ORDER — NAPROXEN 500 MG PO TABS: 500.0000 mg | ORAL_TABLET | Freq: Two times a day (BID) | ORAL | 0 refills | Status: DC

## 2017-05-03 MED ORDER — NEOMYCIN-POLYMYXIN-HC 1 % OT SOLN
3.0000 [drp] | Freq: Four times a day (QID) | OTIC | Status: DC
  Administered 2017-05-03: 3 [drp] via OTIC
  Filled 2017-05-03: qty 10

## 2017-05-03 MED ORDER — OXYCODONE-ACETAMINOPHEN 5-325 MG PO TABS
1.0000 | ORAL_TABLET | Freq: Once | ORAL | Status: AC
Start: 2017-05-03 — End: 2017-05-03
  Administered 2017-05-03: 1 via ORAL
  Filled 2017-05-03: qty 1

## 2017-05-03 MED ORDER — AMOXICILLIN 500 MG PO CAPS: 500.0000 mg | ORAL_CAPSULE | Freq: Three times a day (TID) | ORAL | 0 refills | Status: DC

## 2017-05-03 NOTE — Discharge Instructions (Signed)
Take the medication as directed. Follow up with your doctor in one week to be sure the infection is improving or sooner for any problems.

## 2017-05-03 NOTE — ED Triage Notes (Signed)
Patient complains of left earache with radiation to left neck x 3-4 days, states that the pain has worsened since am, alert and oriented, NAD

## 2017-05-03 NOTE — ED Provider Notes (Signed)
[facility] Provider Note   CSN: [id number] Arrival date & time: 05/03/17  1259     History   Chief Complaint No chief complaint on file.   HPI Angie Key is a 36 y.o. female who presents to the ED with left ear pain that radiates to the left neck x 2 days. Patient states the pain has gotten much worse today. Patient denies fever, chills, URI symptoms. No stiff neck just pain on the left side of neck near ear.   HPI  History reviewed. No pertinent past medical history.  There are no active problems to display for this patient.   Past Surgical History:  Procedure Laterality Date  . TUBAL LIGATION      OB History    No data available       Home Medications    Prior to Admission medications   Medication Sig Start Date End Date Taking? Authorizing Provider  amoxicillin (AMOXIL) 500 MG capsule Take 1 capsule (500 mg total) by mouth 3 (three) times daily. 05/03/17   Janne NapoleonNeese, Mariah Harn M, NP  diclofenac (VOLTAREN) 75 MG EC tablet Take 1 tablet (75 mg total) 2 (two) times daily by mouth. 04/29/17   Asencion IslamStover, Titorya, DPM  EPINEPHrine 0.3 mg/0.3 mL IJ SOAJ injection Inject 0.3 mLs (0.3 mg total) into the muscle once as needed (For severe allergic reaction). 03/20/15   Antony MaduraHumes, Kelly, PA-C  ibuprofen (ADVIL,MOTRIN) 800 MG tablet Take 1 tablet (800 mg total) by mouth 2 (two) times daily as needed. 01/28/17   Asencion IslamStover, Titorya, DPM  naproxen (NAPROSYN) 500 MG tablet Take 1 tablet (500 mg total) by mouth 2 (two) times daily. 05/03/17   Janne NapoleonNeese, Nanda Bittick M, NP    Family History Family History  Problem Relation Age of Onset  . Hypertension Mother   . Aneurysm Mother   . Heart failure Mother   . Hypertension Father   . Gout Father     Social History Social History   Tobacco Use  . Smoking status: Never Smoker  . Smokeless tobacco: Never Used  Substance Use Topics  . Alcohol use: Yes    Comment: weekends  . Drug use: No     Allergies   Bee  venom and Coconut flavor   Review of Systems Review of Systems  Constitutional: Negative for chills and fever.  HENT: Positive for congestion and ear pain. Negative for ear discharge, facial swelling, sinus pain, sore throat and trouble swallowing.   All other systems reviewed and are negative.    Physical Exam Updated Vital Signs BP (!) 153/73 (BP Location: Right Arm)   Pulse 73   Temp 98.3 F (36.8 C) (Oral)   Resp 16   SpO2 99%   Physical Exam  Constitutional: She appears well-developed and well-nourished. No distress.  HENT:  Head: Normocephalic and atraumatic.  Right Ear: Tympanic membrane normal.  Left Ear: No mastoid tenderness. Tympanic membrane is bulging.  Eyes: Conjunctivae and EOM are normal. Pupils are equal, round, and reactive to light.  Neck: Normal range of motion. Neck supple. Muscular tenderness (left) present. No spinous process tenderness present.  Cardiovascular: Normal rate and regular rhythm.  Pulmonary/Chest: Effort normal and breath sounds normal.  Abdominal: Soft. There is no tenderness.  Musculoskeletal: Normal range of motion.  Neurological: She is alert.  Skin: Skin is warm and dry.  Psychiatric: She has a normal mood and affect. Her behavior is normal.  Nursing note and vitals reviewed.    ED  Treatments / Results  Labs (all labs ordered are listed, but only abnormal results are displayed) Labs Reviewed - No data to display  Radiology No results found.  Procedures Procedures (including critical care time)  Medications Ordered in ED Medications  NEOMYCIN-POLYMYXIN-HYDROCORTISONE (CORTISPORIN) OTIC (EAR) solution 3 drop (3 drops Left EAR Given 05/03/17 1545)  oxyCODONE-acetaminophen (PERCOCET/ROXICET) 5-325 MG per tablet 1 tablet (1 tablet Oral Given 05/03/17 1526)  ibuprofen (ADVIL,MOTRIN) tablet 600 mg (600 mg Oral Given 05/03/17 1525)     Initial Impression / Assessment and Plan / ED Course  I have reviewed the triage vital  signs and the nursing notes. Patient presents with otalgia and exam consistent with acute otitis media. No concern for acute mastoiditis, meningitis. No antibiotic use in the last month.  Patient discharged home with Amoxicillin and Cortisporin Otic Susp.   Advised parents to call PCP for follow-up.  I have also discussed reasons to return immediately to the ER.  Patient expresses understanding and agrees with plan.   Final Clinical Impressions(s) / ED Diagnoses   Final diagnoses:  Acute otitis media, left    ED Discharge Orders        Ordered    amoxicillin (AMOXIL) 500 MG capsule  3 times daily     05/03/17 1607    naproxen (NAPROSYN) 500 MG tablet  2 times daily     05/03/17 [address]1607       Sheilyn Boehlke, [place] M, [place]NP 05/03/17 1623    Raeford RazorKohut, Stephen, MD 05/03/17 1627

## 2017-05-03 NOTE — ED Notes (Signed)
Declined W/C at D/C and was escorted to lobby by RN. 

## 2017-05-05 NOTE — Telephone Encounter (Signed)
Angie Key (11:33am) - [employer] states 02/14/2017 after 2 business days needs to file an appeal with Medicaid.

## 2017-05-06 ENCOUNTER — Ambulatory Visit: Payer: Medicaid Other | Admitting: Sports Medicine

## 2017-05-13 ENCOUNTER — Ambulatory Visit (INDEPENDENT_AMBULATORY_CARE_PROVIDER_SITE_OTHER): Payer: Self-pay | Admitting: Neurology

## 2017-05-13 DIAGNOSIS — M79672 Pain in left foot: Secondary | ICD-10-CM

## 2017-05-13 DIAGNOSIS — G5752 Tarsal tunnel syndrome, left lower limb: Secondary | ICD-10-CM

## 2017-05-13 NOTE — Procedures (Signed)
[facility]  [address], [address]  [place], [place] [address] Tel: [phone] Fax:  [phone] Test Date:  05/13/2017  Patient: Angie Key DOB: 07/23/1980 Physician: Nita Sickleonika Ned Kakar, DO  Sex: Female Height: 5\' 4"  Ref Phys: Asencion Islamitorya Stover, DPM  ID#: [id number] Temp: 33.6C Technician:    Patient Complaints: This is a 36 year old female referred for evaluation of left foot pain and to evaluate for tarsal tunnel syndrome.  NCV & EMG Findings: Extensive electrodiagnostic testing of the left lower extremity shows:  1. Left sural, superficial peroneal, medial and lateral plantar responses are within normal limits. 2. Left peroneal and tibial motor responses are within normal limits. 3. Left tibial H reflex study is within normal limits. 4. There is no evidence of active or chronic motor axon loss changes affecting any of the tested muscles. Motor unit configuration and recruitment pattern is within normal limits.  Impression: This is a normal study of the left lower extremity. In particular, there is no evidence of tarsal tunnel syndrome, sensorimotor polyneuropathy, or a lumbosacral radiculopathy.   ___________________________ Nita Sickleonika Jennell Janosik, DO    Nerve Conduction Studies Anti Sensory Summary Table   Site NR Peak (ms) Norm Peak (ms) P-T Amp (V) Norm P-T Amp  Left Sup Peroneal Anti Sensory (Ant Lat Mall)  12 cm    2.2 <4.5 15.9 >5  Left Sural Anti Sensory (Lat Mall)  Calf    2.0 <4.5 21.6 >5   Motor Summary Table   Site NR Onset (ms) Norm Onset (ms) O-P Amp (mV) Norm O-P Amp Site1 Site2 Delta-0 (ms) Dist (cm) Vel (m/s) Norm Vel (m/s)  Left Peroneal Motor (Ext Dig Brev)  Ankle    3.0 <5.5 4.6 >3 B Fib Ankle 6.5 35.0 54 >40  B Fib    9.5  4.0  Poplt B Fib 1.5 9.0 60 >40  Poplt    11.0  4.0         Left Tibial Motor (Abd Hall Brev)  Ankle    3.1 <6.0 10.2 >8 Knee Ankle 7.5 37.0 49 >40  Knee    10.6  8.8         Left Tibial (ADQP) Motor (ADQP)  Ankle    5.2  <6.5 4.4 >4         Mixed Summary Table   Site NR Peak (ms) Norm Peak (ms) P-T Amp (V) Norm P-T Amp  Left Lateral Plantar Mixed (Med Malleolus)  Lateral Foot    2.4 <3.7 12.4 >8  Left Medial Plantar Mixed (Med Malleolus)  Medial Foot    2.1 <3.7 10.6 >8   H Reflex Studies   NR H-Lat (ms) Lat Norm (ms) L-R H-Lat (ms)  Left Tibial (Gastroc)     31.56 <35    EMG   Side Muscle Ins Act Fibs Psw Fasc Number Recrt Dur Dur. Amp Amp. Poly Poly. Comment  Left AntTibialis Nml Nml Nml Nml Nml Nml Nml Nml Nml Nml Nml Nml N/A  Left Gastroc Nml Nml Nml Nml Nml Nml Nml Nml Nml Nml Nml Nml N/A  Left Flex Dig Long Nml Nml Nml Nml Nml Nml Nml Nml Nml Nml Nml Nml N/A  Left AbdDigQuinti Nml Nml Nml Nml Nml Nml Nml Nml Nml Nml Nml Nml N/A  Left AbdHallucis Nml Nml Nml Nml Nml Nml Nml Nml Nml Nml Nml Nml N/A      Waveforms:

## 2017-05-21 ENCOUNTER — Telehealth: Payer: Self-pay | Admitting: Sports Medicine

## 2017-05-21 NOTE — Telephone Encounter (Signed)
Good Afternoon,  Pt has called and left a voicemail about her results if they have came back yet. Please call pt back at [phone].  Thank you, Alcus DadChristy S

## 2017-05-22 ENCOUNTER — Telehealth: Payer: Self-pay | Admitting: Neurology

## 2017-05-22 NOTE — Telephone Encounter (Signed)
I'm not sure what else we can do to help her with this appeal process. I fwd the results of the NCV to you. It was under the procedure tab in the patients chart -Dr. Marylene LandStover

## 2017-05-23 ENCOUNTER — Telehealth: Payer: Self-pay | Admitting: *Deleted

## 2017-05-23 NOTE — Telephone Encounter (Signed)
-----   Message from Asencion Islamitorya Stover, [place] sent at 05/23/2017 10:18 AM EST ----- I already reviewed the results with her, All she needs is a copy of the results  -Dr. Marylene LandStover ----- Message ----- From: Marissa Nestle'Connell, Valery D, RN Sent: 05/23/2017   8:29 AM To: Asencion Islamitorya Stover, DPM  Dr. Marylene LandStover, Do you want J.Smith to copy and send with pt or do you want her to have an appt or me to call with your review of results? Joya SanValery ----- Message ----- From: Asencion IslamStover, Titorya, DPM Sent: 05/22/2017   7:29 PM To: Marissa NestleValery D O'Connell, RN  The results are under the procedure tab in patient chart. -Dr. Marylene LandStover

## 2017-05-23 NOTE — Telephone Encounter (Signed)
Mailed copy of Dr. Eliane DecreePatel's NVC with EMG results.

## 2017-07-17 ENCOUNTER — Other Ambulatory Visit: Payer: Self-pay

## 2017-07-17 ENCOUNTER — Encounter (HOSPITAL_COMMUNITY): Payer: Self-pay | Admitting: Emergency Medicine

## 2017-07-17 ENCOUNTER — Ambulatory Visit (HOSPITAL_COMMUNITY)
Admission: EM | Admit: 2017-07-17 | Discharge: 2017-07-17 | Disposition: A | Discharge status: Home / Self Care | Payer: Self-pay | Attending: Family Medicine | Admitting: Family Medicine

## 2017-07-17 ENCOUNTER — Ambulatory Visit (INDEPENDENT_AMBULATORY_CARE_PROVIDER_SITE_OTHER): Payer: Self-pay

## 2017-07-17 ENCOUNTER — Emergency Department (HOSPITAL_COMMUNITY)
Admission: EM | Admit: 2017-07-17 | Discharge: 2017-07-17 | Disposition: A | Discharge status: Home / Self Care | Payer: Medicaid Other

## 2017-07-17 DIAGNOSIS — R0789 Other chest pain: Secondary | ICD-10-CM

## 2017-07-17 DIAGNOSIS — S39012A Strain of muscle, fascia and tendon of lower back, initial encounter: Secondary | ICD-10-CM

## 2017-07-17 DIAGNOSIS — R079 Chest pain, unspecified: Secondary | ICD-10-CM

## 2017-07-17 MED ORDER — KETOROLAC TROMETHAMINE 60 MG/2ML IM SOLN
60.0000 mg | Freq: Once | INTRAMUSCULAR | Status: AC
  Administered 2017-07-17: 60 mg via INTRAMUSCULAR

## 2017-07-17 MED ORDER — NAPROXEN 500 MG PO TABS: 500.0000 mg | ORAL_TABLET | Freq: Two times a day (BID) | ORAL | 0 refills | Status: DC

## 2017-07-17 MED ORDER — KETOROLAC TROMETHAMINE 60 MG/2ML IM SOLN
INTRAMUSCULAR | Status: AC
  Filled 2017-07-17: qty 2
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
## 2017-07-17 NOTE — ED Notes (Signed)
Pt called for triage x3. No response.  
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
## 2017-08-13 NOTE — Telephone Encounter (Signed)
Error

## 2017-10-22 ENCOUNTER — Ambulatory Visit (HOSPITAL_COMMUNITY)
Admission: EM | Admit: 2017-10-22 | Discharge: 2017-10-22 | Disposition: A | Discharge status: Home / Self Care | Payer: Medicaid Other | Attending: Family Medicine | Admitting: Family Medicine

## 2017-10-22 ENCOUNTER — Other Ambulatory Visit: Payer: Self-pay

## 2017-10-22 ENCOUNTER — Encounter (HOSPITAL_COMMUNITY): Payer: Self-pay | Admitting: Emergency Medicine

## 2017-10-22 DIAGNOSIS — L298 Other pruritus: Secondary | ICD-10-CM

## 2017-10-22 DIAGNOSIS — R21 Rash and other nonspecific skin eruption: Secondary | ICD-10-CM

## 2017-10-22 MED ORDER — METHYLPREDNISOLONE SODIUM SUCC 125 MG IJ SOLR
INTRAMUSCULAR | Status: AC
  Filled 2017-10-22: qty 2

## 2017-10-22 MED ORDER — CETIRIZINE HCL 10 MG PO TABS: 10.0000 mg | ORAL_TABLET | Freq: Every day | ORAL | 0 refills | Status: DC

## 2017-10-22 MED ORDER — PREDNISONE 20 MG PO TABS: 40.0000 mg | ORAL_TABLET | Freq: Every day | ORAL | 0 refills | Status: AC

## 2017-10-22 MED ORDER — METHYLPREDNISOLONE SODIUM SUCC 125 MG IJ SOLR
125.0000 mg | Freq: Once | INTRAMUSCULAR | Status: AC
  Administered 2017-10-22: 125 mg via INTRAMUSCULAR

## 2017-10-22 NOTE — ED Triage Notes (Signed)
The patient presented to the [facility] with a complaint of a rash all over her body that started on her neck 2 days ago.

## 2017-10-22 NOTE — ED Provider Notes (Signed)
[facility]    CSN: [id number] Arrival date & time: 10/22/17  1209     History   Chief Complaint Chief Complaint  Patient presents with  . Rash    HPI Angie Key is a 37 y.o. female.   37 year old female comes in for 2 day history of rash. States it first started with itching of the back of the neck, then started having itching to the upper body. She took benadryl without relief and continued to itch throughout the day. It is now spreading down to her trunk. Denies shortness of breath, wheezing, chest pain, swelling of the throat, trouble swallowing, trouble breathing, drooling, tripoding.  States she has similar episodes every few months, and has had episodes where she needed EpiPen's, but has not found out what causes the symptoms.  Denies any changes in oral intake, hygiene products.     History reviewed. No pertinent past medical history.  There are no active problems to display for this patient.   Past Surgical History:  Procedure Laterality Date  . TUBAL LIGATION      OB History   None      Home Medications    Prior to Admission medications   Medication Sig Start Date End Date Taking? Authorizing Provider  cetirizine (ZYRTEC) 10 MG tablet Take 1 tablet (10 mg total) by mouth daily. 10/22/17   Cathie Hoops, Nuriyah Hanline V, PA-C  EPINEPHrine 0.3 mg/0.3 mL IJ SOAJ injection Inject 0.3 mLs (0.3 mg total) into the muscle once as needed (For severe allergic reaction). 03/20/15   Antony Madura, PA-C  predniSONE (DELTASONE) 20 MG tablet Take 2 tablets (40 mg total) by mouth daily for 5 days. 10/22/17 10/27/17  Belinda Fisher, PA-C    Family History Family History  Problem Relation Age of Onset  . Hypertension Mother   . Aneurysm Mother   . Heart failure Mother   . Hypertension Father   . Gout Father     Social History Social History   Tobacco Use  . Smoking status: Never Smoker  . Smokeless tobacco: Never Used  Substance Use Topics  . Alcohol use: Yes    Comment:  weekends  . Drug use: No     Allergies   Bee venom and Coconut flavor   Review of Systems Review of Systems  Reason unable to perform ROS: See HPI as above.     Physical Exam Triage Vital Signs ED Triage Vitals  Enc Vitals Group     BP 10/22/17 1226 124/66     Pulse Rate 10/22/17 1226 (!) 52     Resp 10/22/17 1226 16     Temp 10/22/17 1226 98.2 F (36.8 C)     Temp Source 10/22/17 1226 Oral     SpO2 10/22/17 1226 100 %     Weight --      Height --      Head Circumference --      Peak Flow --      Pain Score 10/22/17 1224 0     Pain Loc --      Pain Edu? --      Excl. in GC? --    No data found.  Updated Vital Signs BP 124/66 (BP Location: Left Arm)   Pulse (!) 52   Temp 98.2 F (36.8 C) (Oral)   Resp 16   LMP 10/14/2017   SpO2 100%   Physical Exam  Constitutional: She is oriented to person, place, and time. She  appears well-developed and well-nourished. No distress.  HENT:  Head: Normocephalic and atraumatic.  Mouth/Throat: Uvula is midline, oropharynx is clear and moist and mucous membranes are normal. No trismus in the jaw. No uvula swelling.  Eyes: Pupils are equal, round, and reactive to light. Conjunctivae are normal.  Cardiovascular: Normal rate, regular rhythm and normal heart sounds. Exam reveals no gallop and no friction rub.  No murmur heard. Pulmonary/Chest: Effort normal and breath sounds normal. No stridor. No respiratory distress. She has no wheezes. She has no rales.  Neurological: She is alert and oriented to person, place, and time.  Skin: Skin is warm and dry.  See picture below.  No spreading erythema, increased warmth.  Scratch marks seen.       UC Treatments / Results  Labs (all labs ordered are listed, but only abnormal results are displayed) Labs Reviewed - No data to display  EKG None  Radiology No results found.  Procedures Procedures (including critical care time)  Medications Ordered in UC Medications    methylPREDNISolone sodium succinate (SOLU-MEDROL) 125 mg/2 mL injection 125 mg (125 mg Intramuscular Given 10/22/17 1251)    Initial Impression / Assessment and Plan / UC Course  I have reviewed the triage vital signs and the nursing notes.  Pertinent labs & imaging results that were available during my care of the patient were reviewed by me and considered in my medical decision making (see chart for details).    Solu-Medrol injection in office today.  Prednisone as directed.  Antihistamine as directed.  Patient to follow-up with PCP/allergist for further evaluation.  Return precautions given.  Patient expresses understanding and agrees to plan.  Final Clinical Impressions(s) / UC Diagnoses   Final diagnoses:  Rash    ED Prescriptions    Medication Sig Dispense Auth. Provider   predniSONE (DELTASONE) 20 MG tablet Take 2 tablets (40 mg total) by mouth daily for 5 days. 10 tablet Orrin Yurkovich V, PA-C   cetirizine (ZYRTEC) 10 MG tablet Take 1 tablet (10 mg total) by mouth daily. 15 tablet Threasa Alpha, [place] 10/22/17 1302

## 2017-10-22 NOTE — Discharge Instructions (Addendum)
Solu-Medrol injection in office today.  Prednisone as directed.  Zyrtec for itching, she can supplement with Benadryl at night as needed.  Keep diary of what the rashes show and possible causes.  Follow-up with PCP/allergist for further evaluation and management needed.  If experiencing worsening symptoms, shortness of breath, wheezing, trouble breathing, swelling of the throat, leaning forward to breathe, drooling, use your EpiPen, and go to the emergency department for further evaluation.

## 2017-11-27 ENCOUNTER — Encounter (HOSPITAL_COMMUNITY): Payer: Self-pay | Admitting: Family Medicine

## 2017-11-27 ENCOUNTER — Ambulatory Visit (HOSPITAL_COMMUNITY)
Admission: EM | Admit: 2017-11-27 | Discharge: 2017-11-27 | Disposition: A | Discharge status: Home / Self Care | Payer: Medicaid Other | Attending: Family Medicine | Admitting: Family Medicine

## 2017-11-27 DIAGNOSIS — S8011XA Contusion of right lower leg, initial encounter: Secondary | ICD-10-CM

## 2017-11-27 MED ORDER — NAPROXEN 500 MG PO TABS: 500.0000 mg | ORAL_TABLET | Freq: Two times a day (BID) | ORAL | 0 refills | Status: DC

## 2017-11-27 NOTE — ED Triage Notes (Signed)
Pt here for right lateral knee pain and right foot pain after a box of tiles fell on her leg this afternoon. 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

## 2017-11-27 NOTE — ED Provider Notes (Signed)
[facility]    CSN: [id number] Arrival date & time: 11/27/17  1821     History   Chief Complaint Chief Complaint  Patient presents with  . Leg Pain    HPI Angie Key is a 37 y.o. female.   Lamar Sprinklesresha presents with complaints of leg pain after a stack of tile tipped and fell, striking her leg. 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 The tile was propped holding the door. As she walked by the tile fell and struck her. Tenderness and pain to right thigh above knee as well as top of right rood. Pain 7/10. Has not taken any medications for her symptoms. No previous injuries or fractures. Ambulatory still but has had increased pain. No numbness or tingling to her toes. Without contributing medical history.     ROS per HPI.      History reviewed. No pertinent past medical history.  There are no active problems to display for this patient.   Past Surgical History:  Procedure Laterality Date  . TUBAL LIGATION      OB History   None      Home Medications    Prior to Admission medications   Medication Sig Start Date End Date Taking? Authorizing Provider  cetirizine (ZYRTEC) 10 MG tablet Take 1 tablet (10 mg total) by mouth daily. 10/22/17   Cathie HoopsYu, Amy V, PA-C  EPINEPHrine 0.3 mg/0.3 mL IJ SOAJ injection Inject 0.3 mLs (0.3 mg total) into the muscle once as needed (For severe allergic reaction). 03/20/15   Antony MaduraHumes, Kelly, PA-C  naproxen (NAPROSYN) 500 MG tablet Take 1 tablet (500 mg total) by mouth 2 (two) times daily. 11/27/17   Georgetta HaberBurky, Yeison Sippel B, NP    Family History Family History  Problem Relation Age of Onset  . Hypertension Mother   . Aneurysm Mother   . Heart failure Mother   . Hypertension Father   . Gout Father     Social History Social History   Tobacco Use  . Smoking status: Never Smoker  . Smokeless tobacco: Never Used  Substance Use Topics  . Alcohol use: Yes    Comment:  weekends  . Drug use: No     Allergies   Bee venom and Coconut flavor   Review of Systems Review of Systems   Physical Exam Triage Vital Signs ED Triage Vitals [11/27/17 1836]  Enc Vitals Group     BP 126/84     Pulse Rate 76     Resp 18     Temp      Temp src      SpO2 100 %     Weight      Height      Head Circumference      Peak Flow      Pain Score 7     Pain Loc      Pain Edu?      Excl. in GC?    No data found.  Updated Vital Signs BP 126/84   Pulse 76   Resp 18   LMP 11/13/2017   SpO2 100%    Physical Exam  Constitutional: She is oriented to person, place, and time. She appears well-developed and well-nourished. No distress.  Cardiovascular: Normal rate, regular rhythm and normal heart sounds.  Pulmonary/Chest: Effort normal and breath sounds normal.  Musculoskeletal:       Right ankle: She exhibits normal range of  motion, no swelling, no ecchymosis, no deformity, no laceration and normal pulse.       Right upper leg: She exhibits tenderness. She exhibits no bony tenderness, no swelling, no edema, no deformity and no laceration.       Legs:      Feet:  Bruising to right distal lateral thigh just above knee; tenderness to soft tissue without any bony tenderness; tenderness at right lateral foot without bruising or swelling; full ankle and toe ROM; cap refill <2 seconds and strong pedal pulse; no increased pain with knee flexion or extension  Neurological: She is alert and oriented to person, place, and time.  Skin: Skin is warm and dry.     UC Treatments / Results  Labs (all labs ordered are listed, but only abnormal results are displayed) Labs Reviewed - No data to display  EKG None  Radiology No results found.  Procedures Procedures (including critical care time)  Medications Ordered in UC Medications - No data to display  Initial Impression / Assessment and Plan / UC Course  I have reviewed the triage vital signs and the nursing  notes.  Pertinent labs & imaging results that were available during my care of the patient were reviewed by me and considered in my medical decision making (see chart for details).     Patient declines foot xray tonight. Findings consistent with bruising obtained from the tile striking her leg. Ice, elevation, naproxen for pain control. Return precautions provided. Patient verbalized understanding and agreeable to plan.  Ambulatory out of clinic without difficulty.    Final Clinical Impressions(s) / UC Diagnoses   Final diagnoses:  Contusion of multiple sites of right lower extremity, initial encounter     Discharge Instructions     Ice, elevation, naproxen twice a day for pain control.  Activity as tolerated.  If symptoms worsen or do not improve in the next week to return to be seen or to follow up with your PCP.     ED Prescriptions    Medication Sig Dispense Auth. Provider   naproxen (NAPROSYN) 500 MG tablet Take 1 tablet (500 mg total) by mouth 2 (two) times daily. 30 tablet Georgetta Haber, NP     Controlled Substance Prescriptions [place] Controlled Substance Registry consulted? Not Applicable   Georgetta Haber, NP 11/27/17 [phone]

## 2017-11-27 NOTE — Discharge Instructions (Signed)
Ice, elevation, naproxen twice a day for pain control.  Activity as tolerated.  If symptoms worsen or do not improve in the next week to return to be seen or to follow up with your PCP.

## 2018-04-08 ENCOUNTER — Encounter (HOSPITAL_COMMUNITY): Payer: Self-pay | Admitting: Emergency Medicine

## 2018-04-08 ENCOUNTER — Emergency Department (HOSPITAL_COMMUNITY)
Admission: EM | Admit: 2018-04-08 | Discharge: 2018-04-08 | Disposition: A | Discharge status: Home / Self Care | Payer: Medicaid Other | Attending: Emergency Medicine | Admitting: Emergency Medicine

## 2018-04-08 ENCOUNTER — Other Ambulatory Visit: Payer: Self-pay

## 2018-04-08 DIAGNOSIS — W228XXA Striking against or struck by other objects, initial encounter: Secondary | ICD-10-CM | POA: Insufficient documentation

## 2018-04-08 DIAGNOSIS — Y92512 Supermarket, store or market as the place of occurrence of the external cause: Secondary | ICD-10-CM | POA: Insufficient documentation

## 2018-04-08 DIAGNOSIS — Z79899 Other long term (current) drug therapy: Secondary | ICD-10-CM | POA: Insufficient documentation

## 2018-04-08 DIAGNOSIS — Y9389 Activity, other specified: Secondary | ICD-10-CM | POA: Insufficient documentation

## 2018-04-08 DIAGNOSIS — Y990 Civilian activity done for income or pay: Secondary | ICD-10-CM | POA: Insufficient documentation

## 2018-04-08 DIAGNOSIS — S99912A Unspecified injury of left ankle, initial encounter: Secondary | ICD-10-CM

## 2018-04-08 NOTE — Discharge Instructions (Addendum)
Get help right away if: °Your foot, leg, toes, or ankle tingles or becomes numb. °Your foot, leg, toes, or ankle becomes swollen. °Your foot, leg, toes, or ankle turns pale or blue. °

## 2018-04-08 NOTE — ED Triage Notes (Signed)
Pt reports a shelf at the store feel on the back of her left ankle. Skin is intact. Ambulatory to triage.

## 2018-04-08 NOTE — ED Provider Notes (Signed)
[facility] EMERGENCY DEPARTMENT Provider Note   CSN: [id number] Arrival date & time: 04/08/18  1126     History   Chief Complaint Chief Complaint  Patient presents with  . Ankle Injury    HPI Angie Key is a 37 y.o. female who presents emergency department with chief complaint of ankle injury.  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  She has some pain.  She was ambulatory.  She denies significant calf swelling.  She denies weakness.  HPI  No past medical history on file.  There are no active problems to display for this patient.   Past Surgical History:  Procedure Laterality Date  . TUBAL LIGATION       OB History   None      Home Medications    Prior to Admission medications   Medication Sig Start Date End Date Taking? Authorizing Provider  cetirizine (ZYRTEC) 10 MG tablet Take 1 tablet (10 mg total) by mouth daily. 10/22/17   Cathie Hoops, Amy V, PA-C  EPINEPHrine 0.3 mg/0.3 mL IJ SOAJ injection Inject 0.3 mLs (0.3 mg total) into the muscle once as needed (For severe allergic reaction). 03/20/15   Antony Madura, PA-C  naproxen (NAPROSYN) 500 MG tablet Take 1 tablet (500 mg total) by mouth 2 (two) times daily. 11/27/17   Georgetta Haber, NP    Family History Family History  Problem Relation Age of Onset  . Hypertension Mother   . Aneurysm Mother   . Heart failure Mother   . Hypertension Father   . Gout Father     Social History Social History   Tobacco Use  . Smoking status: Never Smoker  . Smokeless tobacco: Never Used  Substance Use Topics  . Alcohol use: Yes    Comment: weekends  . Drug use: No     Allergies   Bee venom and Coconut flavor   Review of Systems Review of Systems  Ten systems reviewed and are negative for acute change, except as noted in the HPI.   Physical Exam Updated Vital Signs BP 115/79 (BP Location: Right Arm)   Pulse  73   Temp 98.1 F (36.7 C) (Oral)   Resp 16   SpO2 100%   Physical Exam  Constitutional: She is oriented to person, place, and time. She appears well-developed and well-nourished. No distress.  HENT:  Head: Normocephalic and atraumatic.  Eyes: Conjunctivae are normal. No scleral icterus.  Neck: Normal range of motion.  Cardiovascular: Normal rate, regular rhythm and normal heart sounds. Exam reveals no gallop and no friction rub.  No murmur heard. Pulmonary/Chest: Effort normal and breath sounds normal. No respiratory distress.  Abdominal: Soft. Bowel sounds are normal. She exhibits no distension and no mass. There is no tenderness. There is no guarding.  Musculoskeletal: She exhibits no edema, tenderness or deformity.  No redness or deformity left ankle.  Negative Thompson test, full range of motion.  Ambulatory  Neurological: She is alert and oriented to person, place, and time.  Skin: Skin is warm and dry. She is not diaphoretic.  Psychiatric: Her behavior is normal.  Nursing note and vitals reviewed.    ED Treatments / Results  Labs (all labs ordered are listed, but only abnormal results are displayed) Labs Reviewed - No data to display  EKG None  Radiology No results found.  Procedures Procedures (including critical care time)  Medications Ordered in ED Medications - No data to display   Initial Impression / Assessment and Plan / ED Course  I have reviewed the triage vital signs and the nursing notes.  Pertinent labs & imaging results that were available during my care of the patient were reviewed by me and considered in my medical decision making (see chart for details).    Patient with Achilles tendon injury .  No necessity for imaging at this time.  She is ambulatory.  Her Achilles tendon is in fact with a negative Thompson's test.  No bruising or deformities.  She is ambulatory and appears appropriate for discharge with symptomatic care. Final Clinical  Impressions(s) / ED Diagnoses   Final diagnoses:  Injury of left ankle, initial encounter    ED Discharge Orders    None       Arthor Captain, PA-C 04/08/18 1243    Linwood Dibbles, MD 04/10/18 [phone]

## 2018-05-17 ENCOUNTER — Encounter (HOSPITAL_COMMUNITY): Payer: Self-pay | Admitting: Emergency Medicine

## 2018-05-17 ENCOUNTER — Emergency Department (HOSPITAL_COMMUNITY)
Admission: EM | Admit: 2018-05-17 | Discharge: 2018-05-17 | Disposition: A | Discharge status: Home / Self Care | Payer: Self-pay | Attending: Emergency Medicine | Admitting: Emergency Medicine

## 2018-05-17 DIAGNOSIS — T7840XA Allergy, unspecified, initial encounter: Secondary | ICD-10-CM | POA: Insufficient documentation

## 2018-05-17 DIAGNOSIS — Z79899 Other long term (current) drug therapy: Secondary | ICD-10-CM | POA: Insufficient documentation

## 2018-05-17 LAB — BASIC METABOLIC PANEL
Anion gap: 10 (ref 5–15)
BUN: 9 mg/dL (ref 6–20)
CALCIUM: 9 mg/dL (ref 8.9–10.3)
CO2: 18 mmol/L — ABNORMAL LOW (ref 22–32)
CREATININE: 0.63 mg/dL (ref 0.44–1.00)
Chloride: 113 mmol/L — ABNORMAL HIGH (ref 98–111)
GFR calc non Af Amer: >60 mL/min (ref >60)
Glucose, Bld: 71 mg/dL (ref 70–99)
Potassium: 3.2 mmol/L — ABNORMAL LOW (ref 3.5–5.1)
SODIUM: 141 mmol/L (ref 135–145)

## 2018-05-17 LAB — CBC
HCT: 28.2 % — ABNORMAL LOW (ref 36.0–46.0)
HEMOGLOBIN: 8.1 g/dL — AB (ref 12.0–15.0)
MCH: 25 pg — AB (ref 26.0–34.0)
MCHC: 28.7 g/dL — ABNORMAL LOW (ref 30.0–36.0)
MCV: 87 fL (ref 80.0–100.0)
NRBC: 0 % (ref 0.0–0.2)
Platelets: 553 10*3/uL — ABNORMAL HIGH (ref 150–400)
RBC: 3.24 MIL/uL — AB (ref 3.87–5.11)
RDW: 20.2 % — ABNORMAL HIGH (ref 11.5–15.5)
WBC: 6.9 10*3/uL (ref 4.0–10.5)

## 2018-05-17 LAB — I-STAT CHEM 8, ED
BUN: 12 mg/dL (ref 6–20)
CALCIUM ION: 1.12 mmol/L — AB (ref 1.15–1.40)
CHLORIDE: 113 mmol/L — AB (ref 98–111)
CREATININE: 0.6 mg/dL (ref 0.44–1.00)
Glucose, Bld: 67 mg/dL — ABNORMAL LOW (ref 70–99)
HEMATOCRIT: 27 % — AB (ref 36.0–46.0)
Hemoglobin: 9.2 g/dL — ABNORMAL LOW (ref 12.0–15.0)
Potassium: 3.3 mmol/L — ABNORMAL LOW (ref 3.5–5.1)
SODIUM: 142 mmol/L (ref 135–145)
TCO2: 18 mmol/L — ABNORMAL LOW (ref 22–32)

## 2018-05-17 LAB — I-STAT BETA HCG BLOOD, ED (MC, WL, AP ONLY): I-stat hCG, quantitative: <5 m[IU]/mL (ref <5)

## 2018-05-17 MED ORDER — PREDNISONE 10 MG (21) PO TBPK: ORAL_TABLET | ORAL | 0 refills | Status: DC

## 2018-05-17 MED ORDER — FAMOTIDINE IN NACL 20-0.9 MG/50ML-% IV SOLN
20.0000 mg | Freq: Once | INTRAVENOUS | Status: AC
Start: 2018-05-17 — End: 2018-05-17
  Administered 2018-05-17: 20 mg via INTRAVENOUS
  Filled 2018-05-17: qty 50

## 2018-05-17 MED ORDER — EPINEPHRINE 0.3 MG/0.3ML IJ SOAJ: 0.3000 mg | Freq: Once | INTRAMUSCULAR | 0 refills | Status: AC

## 2018-05-17 MED ORDER — METHYLPREDNISOLONE SODIUM SUCC 125 MG IJ SOLR
125.0000 mg | Freq: Once | INTRAMUSCULAR | Status: AC
  Administered 2018-05-17: 125 mg via INTRAVENOUS
  Filled 2018-05-17: qty 2

## 2018-05-17 MED ORDER — DIPHENHYDRAMINE HCL 50 MG/ML IJ SOLN
25.0000 mg | Freq: Once | INTRAMUSCULAR | Status: AC
  Administered 2018-05-17: 25 mg via INTRAVENOUS
  Filled 2018-05-17: qty 1

## 2018-05-17 NOTE — ED Provider Notes (Signed)
[facility] Provider Note   CSN: [id number] Arrival date & time: 05/17/18  2536     History   Chief Complaint No chief complaint on file.   HPI [facility] is a 37 y.o. female who presents to the ED with cc of hives and globus sensation. The patient states that she had cooked dinner (chicken) and prepared a salad when she suddenly felt like there was something stuck in her throat that she could not breathe. She then broke out in hives on the insides of bother her arms. She has no known allergies. She has NKA . Patient had unprovoked hives in the past which she attributed to Change in detergent. She denies any active throat swelling. She denies wheezing. She applied cortizone cream to her arms which improved the itching.  HPI  No past medical history on file.  There are no active problems to display for this patient.   Past Surgical History:  Procedure Laterality Date  . TUBAL LIGATION       OB History   None      Home Medications    Prior to Admission medications   Medication Sig Start Date End Date Taking? Authorizing Provider  cetirizine (ZYRTEC) 10 MG tablet Take 1 tablet (10 mg total) by mouth daily. 10/22/17   Cathie Hoops, Amy V, PA-C  EPINEPHrine 0.3 mg/0.3 mL IJ SOAJ injection Inject 0.3 mLs (0.3 mg total) into the muscle once as needed (For severe allergic reaction). 03/20/15   Antony Madura, PA-C  naproxen (NAPROSYN) 500 MG tablet Take 1 tablet (500 mg total) by mouth 2 (two) times daily. 11/27/17   Georgetta Haber, NP    Family History Family History  Problem Relation Age of Onset  . Hypertension Mother   . Aneurysm Mother   . Heart failure Mother   . Hypertension Father   . Gout Father     Social History Social History   Tobacco Use  . Smoking status: Never Smoker  . Smokeless tobacco: Never Used  Substance Use Topics  . Alcohol use: Yes    Comment: weekends  . Drug use: No     Allergies   Bee venom and  Coconut flavor   Review of Systems Review of Systems  Ten systems reviewed and are negative for acute change, except as noted in the HPI.   Physical Exam Updated Vital Signs There were no vitals taken for this visit.  Physical Exam  Constitutional: She is oriented to person, place, and time. She appears well-developed and well-nourished. No distress.  HENT:  Head: Normocephalic and atraumatic.  Eyes: Pupils are equal, round, and reactive to light. Conjunctivae and EOM are normal. No scleral icterus.  Neck: Normal range of motion.  Cardiovascular: Normal rate, regular rhythm and normal heart sounds. Exam reveals no gallop and no friction rub.  No murmur heard. Pulmonary/Chest: Effort normal and breath sounds normal. No stridor. No respiratory distress. She has no wheezes.  Abdominal: Soft. Bowel sounds are normal. She exhibits no distension and no mass. There is no tenderness. There is no guarding.  Neurological: She is alert and oriented to person, place, and time.  Skin: Skin is warm and dry. She is not diaphoretic.  Hives on the medial arms BL  Psychiatric: Her behavior is normal.  Nursing note and vitals reviewed.    ED Treatments / Results  Labs (all labs ordered are listed, but only abnormal results are displayed) Labs Reviewed - No data to  display  EKG None  Radiology No results found.  Procedures Procedures (including critical care time)  Medications Ordered in ED Medications - No data to display   Initial Impression / Assessment and Plan / ED Course  I have reviewed the triage vital signs and the nursing notes.  Pertinent labs & imaging results that were available during my care of the patient were reviewed by me and considered in my medical decision making (see chart for details).     [facility] is a 37 y.o. female who presents to ED for transient globus sensation and hives. No known triggers or exposures. No evidence of oral swelling or airway  compromise. Lungs are clear bilaterally and vitals stable. No respiratory, GI or neurologic symptoms to suggest anaphylaxis. Given Benadryl, pepcid, and steroids in ED. Monitored for 3 hours. On re-evaluation, symptoms have totally resolved. Patient feels comfortable with discharge to home. Evaluation does not show pathology that would require ongoing emergent intervention or inpatient treatment. Rx for steroids and epipen given. Home care instructions and return precautions discussed. Allergist follow up encouraged. All questions answered.   Final Clinical Impressions(s) / ED Diagnoses   Final diagnoses:  Allergic reaction, initial encounter    ED Discharge Orders    None       [facility], Angie Nunn, PA-C 05/17/18 2250    Key, Ambrose Finlandachel Morgan, MD 05/18/18 0025

## 2018-05-17 NOTE — ED Triage Notes (Signed)
Patient reports sudden onset itchy skin  rashes/hives at upper arms with nasal congestion/rhinorrhea this evening , airway intact/speaking clearly , denies fever /mild SOB .

## 2018-05-17 NOTE — Discharge Instructions (Addendum)
Get help right away if: °You needed to use epinephrine. °An epinephrine injection helps to manage life-threatening allergic reactions, but you still need to go to the emergency room even if epinephrine seems to work. This is important because anaphylaxis may happen again within 72 hours (rebound anaphylaxis). °If you used epinephrine to treat anaphylaxis outside of the hospital, you need additional medical care. This may include more doses of epinephrine. °You develop: °A tight feeling in your chest or throat. °Wheezing or difficulty breathing. °Hives. °Red skin or itching all over your body. °Swelling in your lips, tongue, or the back of your throat. °You have severe vomiting or diarrhea. °You faint or you feel like you are going to faint. °

## 2018-05-18 ENCOUNTER — Emergency Department (HOSPITAL_COMMUNITY)
Admission: EM | Admit: 2018-05-18 | Discharge: 2018-05-19 | Disposition: A | Discharge status: Home / Self Care | Payer: Medicaid Other | Attending: Emergency Medicine | Admitting: Emergency Medicine

## 2018-05-18 DIAGNOSIS — T7840XA Allergy, unspecified, initial encounter: Secondary | ICD-10-CM | POA: Insufficient documentation

## 2018-05-18 LAB — BASIC METABOLIC PANEL
Anion gap: 10 (ref 5–15)
BUN: 17 mg/dL (ref 6–20)
CO2: 18 mmol/L — ABNORMAL LOW (ref 22–32)
Calcium: 9.1 mg/dL (ref 8.9–10.3)
Chloride: 113 mmol/L — ABNORMAL HIGH (ref 98–111)
Creatinine, Ser: 0.79 mg/dL (ref 0.44–1.00)
GFR calc Af Amer: >60 mL/min (ref >60)
GFR calc non Af Amer: >60 mL/min (ref >60)
Glucose, Bld: 125 mg/dL — ABNORMAL HIGH (ref 70–99)
Potassium: 3.6 mmol/L (ref 3.5–5.1)
Sodium: 141 mmol/L (ref 135–145)

## 2018-05-18 LAB — CBC WITH DIFFERENTIAL/PLATELET
Abs Immature Granulocytes: 0.04 10*3/uL (ref 0.00–0.07)
BASOS ABS: 0 10*3/uL (ref 0.0–0.1)
Basophils Relative: 0 %
EOS ABS: 0 10*3/uL (ref 0.0–0.5)
Eosinophils Relative: 0 %
HCT: 33.4 % — ABNORMAL LOW (ref 36.0–46.0)
Hemoglobin: 9.6 g/dL — ABNORMAL LOW (ref 12.0–15.0)
IMMATURE GRANULOCYTES: 0 %
LYMPHS ABS: 4.1 10*3/uL — AB (ref 0.7–4.0)
LYMPHS PCT: 37 %
MCH: 25.9 pg — ABNORMAL LOW (ref 26.0–34.0)
MCHC: 28.7 g/dL — ABNORMAL LOW (ref 30.0–36.0)
MCV: 90 fL (ref 80.0–100.0)
Monocytes Absolute: 0.7 10*3/uL (ref 0.1–1.0)
Monocytes Relative: 6 %
NEUTROS PCT: 57 %
Neutro Abs: 6.2 10*3/uL (ref 1.7–7.7)
Platelets: 719 10*3/uL — ABNORMAL HIGH (ref 150–400)
RBC: 3.71 MIL/uL — AB (ref 3.87–5.11)
RDW: 20.5 % — ABNORMAL HIGH (ref 11.5–15.5)
WBC: 11.1 10*3/uL — ABNORMAL HIGH (ref 4.0–10.5)
nRBC: 0 % (ref 0.0–0.2)

## 2018-05-18 LAB — I-STAT BETA HCG BLOOD, ED (MC, WL, AP ONLY)

## 2018-05-18 MED ORDER — METHYLPREDNISOLONE SODIUM SUCC 125 MG IJ SOLR
125.0000 mg | Freq: Once | INTRAMUSCULAR | Status: AC
  Administered 2018-05-18: 125 mg via INTRAVENOUS
  Filled 2018-05-18: qty 2

## 2018-05-18 MED ORDER — LORAZEPAM 2 MG/ML IJ SOLN
1.0000 mg | Freq: Once | INTRAMUSCULAR | Status: AC
  Administered 2018-05-18: 1 mg via INTRAVENOUS
  Filled 2018-05-18: qty 1

## 2018-05-18 MED ORDER — EPINEPHRINE 0.3 MG/0.3ML IJ SOAJ
0.3000 mg | Freq: Once | INTRAMUSCULAR | Status: AC
  Administered 2018-05-18: 0.3 mg via INTRAMUSCULAR

## 2018-05-18 MED ORDER — SODIUM CHLORIDE 0.9 % IV BOLUS
1000.0000 mL | Freq: Once | INTRAVENOUS | Status: AC
  Administered 2018-05-18: 1000 mL via INTRAVENOUS

## 2018-05-18 MED ORDER — DIPHENHYDRAMINE HCL 50 MG/ML IJ SOLN
50.0000 mg | Freq: Once | INTRAMUSCULAR | Status: AC
  Administered 2018-05-18: 50 mg via INTRAVENOUS

## 2018-05-18 MED ORDER — SODIUM CHLORIDE 0.9 % IV SOLN
40.0000 mg | Freq: Once | INTRAVENOUS | Status: AC
  Administered 2018-05-18: 40 mg via INTRAVENOUS
  Filled 2018-05-18: qty 4

## 2018-05-18 MED ORDER — EPINEPHRINE 0.3 MG/0.3ML IJ SOAJ: 0.3000 mg | Freq: Once | INTRAMUSCULAR | Status: DC

## 2018-05-18 NOTE — ED Triage Notes (Signed)
Pt arrived via POV with an allergic reaction, hives noted and SOB.

## 2018-05-18 NOTE — ED Notes (Signed)
Bed: RESB Expected date:  Expected time:  Means of arrival:  Comments: Allergic rx

## 2018-05-18 NOTE — ED Provider Notes (Signed)
Allergic reaction yesterday - ED treatment and discharged home. She did not have her prescriptions filled. Today developed hives today, lip swelling. Better with Epi Symptoms improved, resolved Observation period until midnight  Plan: anticipate discharge home  12:20 - recheck: patient continues to be asymptomatic. Discussed the importance of filling the Rx's provided on previous visit. Discussed return precautions, and also the importance of PCP follow up to insure symptoms of resolved.    Elpidio AnisUpstill, Ottavio Norem, PA-C 05/19/18 [id number]    Wynetta FinesMessick, Peter C, MD 05/23/18 [phone]

## 2018-05-18 NOTE — ED Provider Notes (Signed)
[facility]-EMERGENCY DEPT Provider Note   CSN: [id number] Arrival date & time: 05/18/18  2013     History   Chief Complaint Chief Complaint  Patient presents with  . Allergic Reaction    HPI [facility] is a 37 y.o. female with no significant past history presents emergency department for allergic reaction.  Patient was seen in [facility] yesterday for an allergic reaction.  Patient  was given Benadryl, Pepcid and steroids and monitored for 3 hours.  Her symptoms had completely resolved and she was discharged home on steroids, as needed Benadryl and given an EpiPen.  She reports she has not filled her prescriptions however.  She notes while at work she had the onset of symptoms.  She reports global hives, pruritus, lip swelling, and shortness of breath.  She was rushed back by nursing staff and I was called to respond to the patient's room.  She is hyperventilating and appears tearful.  HPI  No past medical history on file.  There are no active problems to display for this patient.   Past Surgical History:  Procedure Laterality Date  . TUBAL LIGATION       OB History   None      Home Medications    Prior to Admission medications   Medication Sig Start Date End Date Taking? Authorizing Provider  cetirizine (ZYRTEC) 10 MG tablet Take 1 tablet (10 mg total) by mouth daily. Patient not taking: Reported on 05/17/2018 10/22/17   Belinda Fisher, PA-C  naproxen (NAPROSYN) 500 MG tablet Take 1 tablet (500 mg total) by mouth 2 (two) times daily. Patient not taking: Reported on 05/17/2018 11/27/17   Georgetta Haber, NP  predniSONE (STERAPRED UNI-PAK 21 TAB) 10 MG (21) TBPK tablet Use as directed 05/17/18   Arthor Captain, PA-C    Family History Family History  Problem Relation Age of Onset  . Hypertension Mother   . Aneurysm Mother   . Heart failure Mother   . Hypertension Father   . Gout Father     Social History Social History   Tobacco Use  . Smoking  status: Never Smoker  . Smokeless tobacco: Never Used  Substance Use Topics  . Alcohol use: Yes    Comment: weekends  . Drug use: No     Allergies   Bee venom and Coconut flavor   Review of Systems Review of Systems  All other systems reviewed and are negative.    Physical Exam Updated Vital Signs BP 138/84 (BP Location: Left Arm)   Pulse (!) 117   Temp (!) 97.5 F (36.4 C) (Oral)   Resp 18   Ht 5\' 3"  (1.6 m)   Wt 79 kg   SpO2 100%   BMI 30.85 kg/m   Physical Exam  Constitutional: She appears well-developed and well-nourished.  Anxious appearing.  Hyperventilating.  Tearful.  HENT:  Head: Normocephalic and atraumatic.  Right Ear: External ear normal.  Left Ear: External ear normal.  Nose: Nose normal.  Mouth/Throat: Uvula is midline, oropharynx is clear and moist and mucous membranes are normal. No tonsillar exudate.  Normal phonation.  No tongue swelling.  No uvular swelling.  Mild lip swelling and facial swelling.  Eyes: Pupils are equal, round, and reactive to light. Right eye exhibits no discharge. Left eye exhibits no discharge. No scleral icterus.  Neck: Trachea normal. Neck supple. No spinous process tenderness present. No neck rigidity. Normal range of motion present.  Cardiovascular: Normal rate, regular rhythm  and intact distal pulses.  No murmur heard. Pulses:      Radial pulses are 2+ on the right side, and 2+ on the left side.       Dorsalis pedis pulses are 2+ on the right side, and 2+ on the left side.       Posterior tibial pulses are 2+ on the right side, and 2+ on the left side.  No lower extremity swelling or edema. Calves symmetric in size bilaterally.  Pulmonary/Chest: Breath sounds normal. Tachypnea noted. She exhibits no tenderness.  Abdominal: Soft. Bowel sounds are normal. There is no tenderness. There is no rebound and no guarding.  Musculoskeletal: She exhibits no edema.  Lymphadenopathy:    She has no cervical adenopathy.    Neurological: She is alert.  Skin: Skin is warm and dry. No rash noted.  Patient diffusely covering times over legs, arms, chest, abdomen, back, neck and lower face she is she  Psychiatric: She has a normal mood and affect.  Nursing note and vitals reviewed.    ED Treatments / Results  Labs (all labs ordered are listed, but only abnormal results are displayed) Labs Reviewed  BASIC METABOLIC PANEL - Abnormal; Notable for the following components:      Result Value   Chloride 113 (*)    CO2 18 (*)    Glucose, Bld 125 (*)    All other components within normal limits  CBC WITH DIFFERENTIAL/PLATELET - Abnormal; Notable for the following components:   WBC 11.1 (*)    RBC 3.71 (*)    Hemoglobin 9.6 (*)    HCT 33.4 (*)    MCH 25.9 (*)    MCHC 28.7 (*)    RDW 20.5 (*)    Platelets 719 (*)    Lymphs Abs 4.1 (*)    All other components within normal limits  I-STAT BETA HCG BLOOD, ED ([facility], [facility], [facility] ONLY)    EKG EKG Interpretation  Date/Time:  Monday May 18 2018 21:24:26 EST Ventricular Rate:  68 PR Interval:    QRS Duration: 91 QT Interval:  414 QTC Calculation: 441 R Axis:   44 Text Interpretation:  Sinus rhythm Confirmed by Kristine Royal [phone]) on 05/18/2018 9:28:15 PM   Radiology No results found.  Procedures Procedures (including critical care time) CRITICAL CARE Performed by: Jacinto Halim   Total critical care time: 60 minutes  Critical care time was exclusive of separately billable procedures and treating other patients.  Critical care was necessary to treat or prevent imminent or life-threatening deterioration.  Critical care was time spent personally by me on the following activities: development of treatment plan with patient and/or surrogate as well as nursing, discussions with consultants, evaluation of patient's response to treatment, examination of patient, obtaining history from patient or surrogate, ordering and performing treatments and  interventions, ordering and review of laboratory studies, ordering and review of radiographic studies, pulse oximetry and re-evaluation of patient's condition.  Medications Ordered in ED Medications  sodium chloride 0.9 % bolus 1,000 mL (has no administration in time range)  methylPREDNISolone sodium succinate (SOLU-MEDROL) 125 mg/2 mL injection 125 mg (has no administration in time range)  famotidine (PEPCID) 40 mg in sodium chloride 0.9 % 50 mL IVPB (has no administration in time range)  diphenhydrAMINE (BENADRYL) injection 50 mg (has no administration in time range)     Initial Impression / Assessment and Plan / ED Course  I have reviewed the triage vital signs and the nursing notes.  Pertinent  labs & imaging results that were available during my care of the patient were reviewed by me and considered in my medical decision making (see chart for details).      8:20 PM I was called in by nursing staff as patient appeared to be having allergic reaction.  Patient reports she had an allergic reaction yesterday to what she thought was a liver detox medicine she started taking.  She was prescribed steroids, EpiPen and told to take Benadryl but did not get her prescriptions filled.  When she was at work today she started having hives that started on her arms and legs.  Is since spread to her torso, abdomen, back, face.  She feels like her lips are swelling.  She also reports shortness of breath and is noted to be hyperventilating and crying in the recess room.  Patient was given EpiPen.  Will obtain basic lab work, give IV Solu-Medrol, Benadryl, famotidine, IV fluids and observe for 4 hours.  9:55 PM Patients hives resolved. No SOB. Notes mild itching of extremities. No angioedema. Labs reviewed and reassuring. EKG NSR. Will continue to monitor.   11:11 PM Patient sleeping. [place] and states she is asymptomatic at this time. No hives, no evidence of angioedema, lungs CTA b/l. Case signed out to  Genuine PartsShari Upstill, PA-C. Plan to observe till 1220. If remains asymptomatic, can be discharged and instructed to take prednisone previously rx'd with instructions for follow up with pcp. If rebound symptoms occur, patient will likely require admission for observation.   Patient case discussed with Dr. Rodena MedinMessick who is in agreement with plan.  Final Clinical Impressions(s) / ED Diagnoses   Final diagnoses:  Allergic reaction, initial encounter    ED Discharge Orders    None       Princella PellegriniMaczis, Michael M, PA-C 05/18/18 2316    Wynetta FinesMessick, Peter C, MD 05/23/18 [phone]

## 2018-05-19 NOTE — Discharge Instructions (Signed)
Fill your prescriptions and take as directed. Follow up with your primary care provider this week for recheck. Certainly, if you have any new or worsening symptoms, return to the emergency department at any time.

## 2018-05-29 ENCOUNTER — Encounter (HOSPITAL_COMMUNITY): Payer: Self-pay | Admitting: Internal Medicine

## 2018-05-29 ENCOUNTER — Emergency Department (HOSPITAL_COMMUNITY)
Admission: EM | Admit: 2018-05-29 | Discharge: 2018-05-29 | Disposition: A | Discharge status: Home / Self Care | Payer: Medicaid Other | Attending: Emergency Medicine | Admitting: Emergency Medicine

## 2018-05-29 DIAGNOSIS — J101 Influenza due to other identified influenza virus with other respiratory manifestations: Secondary | ICD-10-CM | POA: Insufficient documentation

## 2018-05-29 DIAGNOSIS — J111 Influenza due to unidentified influenza virus with other respiratory manifestations: Secondary | ICD-10-CM

## 2018-05-29 DIAGNOSIS — R69 Illness, unspecified: Secondary | ICD-10-CM

## 2018-05-29 NOTE — ED Notes (Signed)
D/c reviewed with patient 

## 2018-05-29 NOTE — Discharge Instructions (Signed)
Please read the discharge instructions provided °

## 2018-05-29 NOTE — ED Provider Notes (Addendum)
[facility]   CSN: [id number] Arrival date & time: 05/29/18  0844     History   Chief Complaint Chief Complaint  Patient presents with  . Cough  . Generalized Body Aches    HPI [facility] is a 37 y.o. female.  HPI Patient is a 37 year old female presents the emergency department with cough sore throat myalgias chills and multiple recent contacts who tested positive for flu including her own children.  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  She denies vomiting.  She reports diarrhea this morning.  She denies abdominal pain.  No significant chest pain.  No exertional shortness of breath.  Symptoms are mild to moderate in severity.  No immunosuppressants.  Patient is not pregnant.  None    None     History reviewed. No pertinent past medical history.  There are no active problems to display for this patient.   Past Surgical History:  Procedure Laterality Date  . TUBAL LIGATION       OB History   No obstetric history on file.      Home Medications    Prior to Admission medications   Not on File    Family History Family History  Problem Relation Age of Onset  . Hypertension Mother   . Aneurysm Mother   . Heart failure Mother   . Hypertension Father   . Gout Father     Social History Social History   Tobacco Use  . Smoking status: Never Smoker  . Smokeless tobacco: Never Used  Substance Use Topics  . Alcohol use: Yes    Comment: weekends  . Drug use: No     Allergies   Bee venom and Coconut flavor   Review of Systems Review of Systems  All other systems reviewed and are negative.    Physical Exam Updated Vital Signs Pulse 82   Temp 98.4 F (36.9 C) (Oral)   Resp 13   SpO2 98%   Physical Exam Vitals signs and nursing note reviewed.  Constitutional:      General: She is not in acute distress.    Appearance: She is well-developed.  HENT:     Head:  Normocephalic and atraumatic.     Mouth/Throat:     Mouth: Mucous membranes are moist.     Pharynx: Oropharynx is clear. Posterior oropharyngeal erythema present. No oropharyngeal exudate.  Neck:     Musculoskeletal: Normal range of motion.  Cardiovascular:     Rate and Rhythm: Normal rate and regular rhythm.     Heart sounds: Normal heart sounds.  Pulmonary:     Effort: Pulmonary effort is normal.     Breath sounds: Normal breath sounds.  Abdominal:     General: There is no distension.     Palpations: Abdomen is soft.     Tenderness: There is no abdominal tenderness.  Musculoskeletal: Normal range of motion.  Skin:    General: Skin is warm and dry.  Neurological:     Mental Status: She is alert and oriented to person, place, and time.  Psychiatric:        Judgment: Judgment normal.      ED Treatments / Results  Labs (all labs ordered are listed, but only abnormal results are displayed) Labs Reviewed - No data to display  EKG None Medical screening examination/treatment/procedure(s) were performed by non-physician practitioner and as supervising physician I was immediately available for consultation/collaboration.  None    Radiology No results found.  Procedures Procedures (including critical care time)  Medications Ordered in ED Medications - No data to display   Initial Impression / Assessment and Plan / ED Course  I have reviewed the triage vital signs and the nursing notes.  Pertinent labs & imaging results that were available during my care of the patient were reviewed by me and considered in my medical decision making (see chart for details).    Influenza-like illness.  Supportive care recommended.  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  I do not think she needs additional work-up at this time.  Given her young age and no medical comorbidities she does not need Tamiflu.  She is encouraged to return to the emergency  department for new or worsening symptoms or follow-up closely with her physician   Final Clinical Impressions(s) / ED Diagnoses   Final diagnoses:  Influenza-like illness    ED Discharge Orders    None       Azalia Bilisampos, Carlyne Keehan, MD 05/29/18 [id number]    Azalia Bilisampos, Cassadie Pankonin, MD 05/29/18 [phone]

## 2018-05-29 NOTE — ED Triage Notes (Addendum)
Pt here for productive cough that started 3 days ago. Pt developed generalized body aches yesterday, and diarrhea/nasal congestion began today. Denies receiving flu shot this year. Reports use of theraflu and cough drops for symptom relief. Endorses nausea yesterday. Denies vomiting.

## 2018-05-29 NOTE — ED Notes (Signed)
ED Provider at bedside. 

## 2018-05-29 NOTE — ED Notes (Signed)
Patient verbalizes understanding of discharge instructions. Opportunity for questioning and answers were provided. Armband removed by staff, pt discharged from ED.  

## 2018-06-14 ENCOUNTER — Emergency Department (HOSPITAL_COMMUNITY)
Admission: EM | Admit: 2018-06-14 | Discharge: 2018-06-14 | Disposition: A | Discharge status: Home / Self Care | Payer: Medicaid Other | Attending: Emergency Medicine | Admitting: Emergency Medicine

## 2018-06-14 ENCOUNTER — Other Ambulatory Visit: Payer: Self-pay

## 2018-06-14 DIAGNOSIS — J020 Streptococcal pharyngitis: Secondary | ICD-10-CM

## 2018-06-14 LAB — GROUP A STREP BY PCR: Group A Strep by PCR: DETECTED — AB

## 2018-06-14 MED ORDER — LIDOCAINE VISCOUS HCL 2 % MT SOLN
15.0000 mL | Freq: Once | OROMUCOSAL | Status: AC
  Administered 2018-06-14: 15 mL via OROMUCOSAL
  Filled 2018-06-14: qty 15

## 2018-06-14 MED ORDER — ACETAMINOPHEN 325 MG PO TABS
650.0000 mg | ORAL_TABLET | Freq: Once | ORAL | Status: AC
  Administered 2018-06-14: 650 mg via ORAL
  Filled 2018-06-14: qty 2

## 2018-06-14 MED ORDER — PENICILLIN G BENZATHINE 1200000 UNIT/2ML IM SUSP
1.2000 10*6.[IU] | Freq: Once | INTRAMUSCULAR | Status: AC
  Administered 2018-06-14: 1.2 10*6.[IU] via INTRAMUSCULAR
  Filled 2018-06-14: qty 2

## 2018-06-14 MED ORDER — IBUPROFEN 200 MG PO TABS
600.0000 mg | ORAL_TABLET | Freq: Once | ORAL | Status: AC | PRN
  Administered 2018-06-14: 600 mg via ORAL
  Filled 2018-06-14: qty 3

## 2018-06-14 MED ORDER — LIDOCAINE VISCOUS HCL 2 % MT SOLN: OROMUCOSAL | 0 refills | Status: AC

## 2018-06-14 MED ORDER — DEXAMETHASONE 4 MG PO TABS
10.0000 mg | ORAL_TABLET | Freq: Once | ORAL | Status: AC
  Administered 2018-06-14: 10 mg via ORAL
  Filled 2018-06-14: qty 2

## 2018-06-14 NOTE — Discharge Instructions (Addendum)
Be sure you are drinking plenty of fluids to prevent dehydration. Take tylenol and ibuprofen as needed for pain and fever. Follow up with your primary care doctor or return here for worsening symptoms.

## 2018-06-14 NOTE — ED Provider Notes (Signed)
[name]-EMERGENCY DEPT Provider Note   CSN: [id number] Arrival date & time: 06/14/18  1031     History   Chief Complaint Chief Complaint  Patient presents with  . Sore Throat  . Otalgia  . Facial Pain    HPI Angie Key is a 38 y.o. female who presents to the ED for sore throat and fever. Patient reports she just got over the flu and then a couple days ago started back with fever, chills, body ache and terrible sore throat.   HPI  No past medical history on file.  There are no active problems to display for this patient.   Past Surgical History:  Procedure Laterality Date  . TUBAL LIGATION       OB History   No obstetric history on file.      Home Medications    Prior to Admission medications   Medication Sig Start Date End Date Taking? Authorizing Provider  lidocaine (XYLOCAINE) 2 % solution Gargle and spit or swallow 10 cc every 3 hours as needed for pain 06/14/18   Janne Napoleon, NP    Family History Family History  Problem Relation Age of Onset  . Hypertension Mother   . Aneurysm Mother   . Heart failure Mother   . Hypertension Father   . Gout Father     Social History Social History   Tobacco Use  . Smoking status: Never Smoker  . Smokeless tobacco: Never Used  Substance Use Topics  . Alcohol use: Yes    Comment: weekends  . Drug use: No     Allergies   Bee venom and Coconut flavor   Review of Systems Review of Systems  Constitutional: Positive for chills and fever.  HENT: Positive for ear pain and sore throat. Negative for trouble swallowing.   Eyes: Negative for visual disturbance.  Respiratory: Negative for cough, shortness of breath and wheezing.   Gastrointestinal: Negative for nausea and vomiting.  Genitourinary: Negative for dysuria and frequency.  Musculoskeletal: Positive for myalgias. Negative for neck stiffness.  Skin: Negative for rash.  Neurological: Positive for headaches. Negative for  syncope.  Hematological: Positive for adenopathy.  Psychiatric/Behavioral: Negative for confusion.     Physical Exam Updated Vital Signs BP 133/85 (BP Location: Right Arm)   Pulse 85   Temp (!) 100.9 F (38.3 C) (Oral)   Resp 16   LMP 06/09/2018   SpO2 98%   Physical Exam Vitals signs and nursing note reviewed.  Constitutional:      Appearance: She is well-developed.  HENT:     Right Ear: Tympanic membrane normal.     Left Ear: Tympanic membrane normal.     Mouth/Throat:     Pharynx: Uvula midline. Pharyngeal swelling, oropharyngeal exudate and posterior oropharyngeal erythema present.  Neck:     Musculoskeletal: Neck supple.  Cardiovascular:     Rate and Rhythm: Normal rate.  Pulmonary:     Effort: Pulmonary effort is normal.  Abdominal:     Palpations: Abdomen is soft.     Tenderness: There is no abdominal tenderness.  Musculoskeletal: Normal range of motion.  Lymphadenopathy:     Cervical: Cervical adenopathy present.  Skin:    General: Skin is warm and dry.  Neurological:     Mental Status: She is alert and oriented to person, place, and time.     Cranial Nerves: No cranial nerve deficit.  Psychiatric:        Mood and Affect: Mood normal.  ED Treatments / Results  Labs (all labs ordered are listed, but only abnormal results are displayed) Labs Reviewed  GROUP A STREP BY PCR - Abnormal; Notable for the following components:      Result Value   Group A Strep by PCR DETECTED (*)    All other components within normal limits    Radiology No results found.  Procedures Procedures (including critical care time)  Medications Ordered in ED Medications  ibuprofen (ADVIL,MOTRIN) tablet 600 mg (600 mg Oral Given 06/14/18 1042)  acetaminophen (TYLENOL) tablet 650 mg (650 mg Oral Given 06/14/18 1233)  dexamethasone (DECADRON) tablet 10 mg (10 mg Oral Given 06/14/18 1239)  penicillin g benzathine (BICILLIN LA) 1200000 UNIT/2ML injection 1.2 Million Units (1.2  Million Units Intramuscular Given 06/14/18 1235)  lidocaine (XYLOCAINE) 2 % viscous mouth solution 15 mL (15 mLs Mouth/Throat Given 06/14/18 1239)     Initial Impression / Assessment and Plan / ED Course  I have reviewed the triage vital signs and the nursing notes. Pt febrile with tonsillar exudate, cervical lymphadenopathy, & dysphagia; diagnosis of bacterial pharyngitis. Treated in the ED with steroids, NSAIDs and PCN IM.  Pt appears mildly dehydrated, discussed importance of water rehydration. Presentation non concerning for PTA or RPA. No trismus or uvula deviation. Specific return precautions discussed. Pt able to drink water in ED without difficulty with intact air way. Recommended PCP follow up.   Final Clinical Impressions(s) / ED Diagnoses   Final diagnoses:  Strep pharyngitis    ED Discharge Orders         Ordered    lidocaine (XYLOCAINE) 2 % solution     06/14/18 [address] [place], NP 06/14/18 1252    Tilden Fossa, MD 06/15/18 [phone]

## 2018-06-14 NOTE — ED Triage Notes (Signed)
Pt c/o sore throat x several days with facial pain and bilateral ear pain. Pt with tonsilitis and white patches on tonsils. Pt with difficulty swallowing but has been gargling with salt water.

## 2018-09-16 IMAGING — CR DG FOOT COMPLETE 3+V*L*
3 series · 3 of 3 positions shown · non-contrast
Comparison: None.

CLINICAL DATA: Left foot pain after injury. Bariselli drawer fell on
top of foot.

EXAM:
LEFT FOOT - COMPLETE 3+ VIEW

[foot ap]
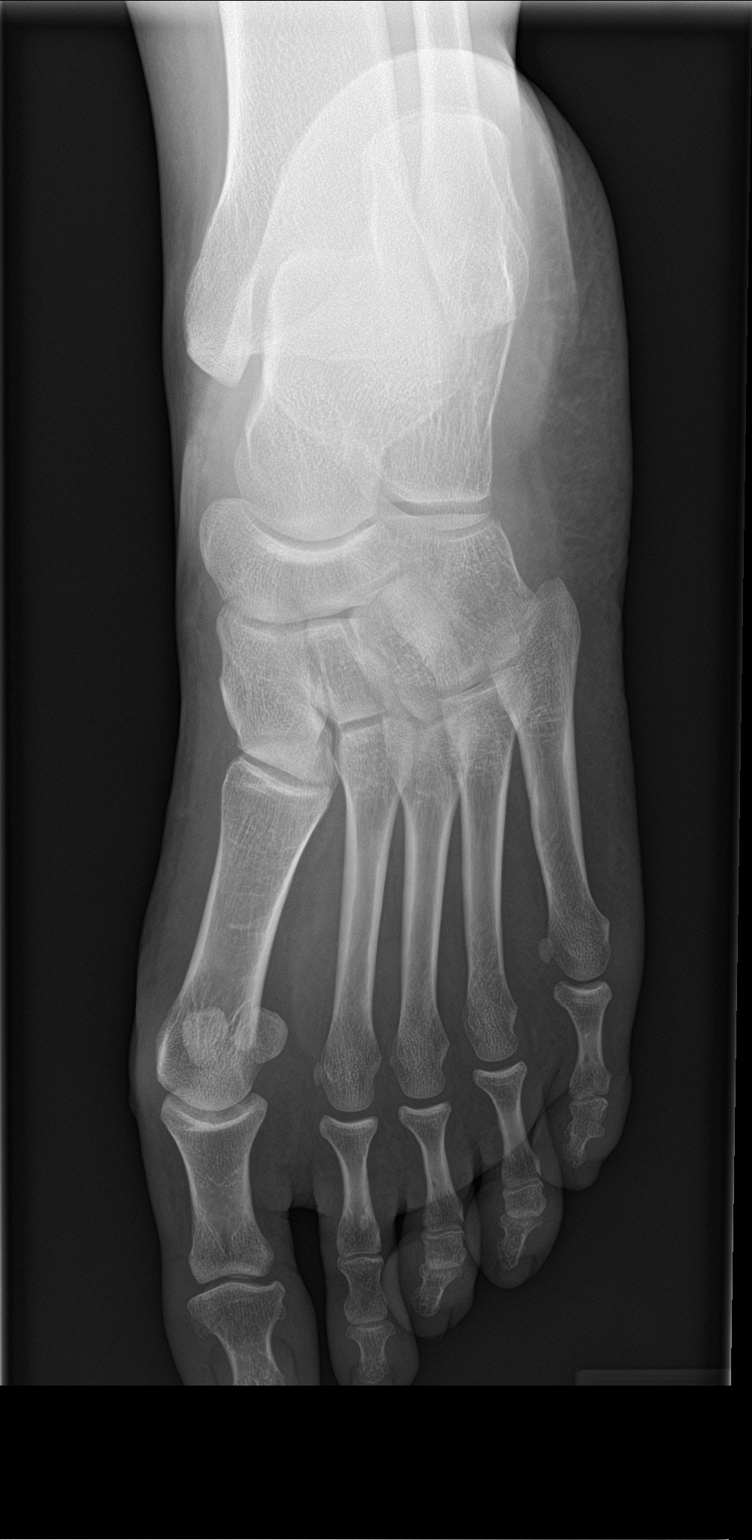

[foot obl]
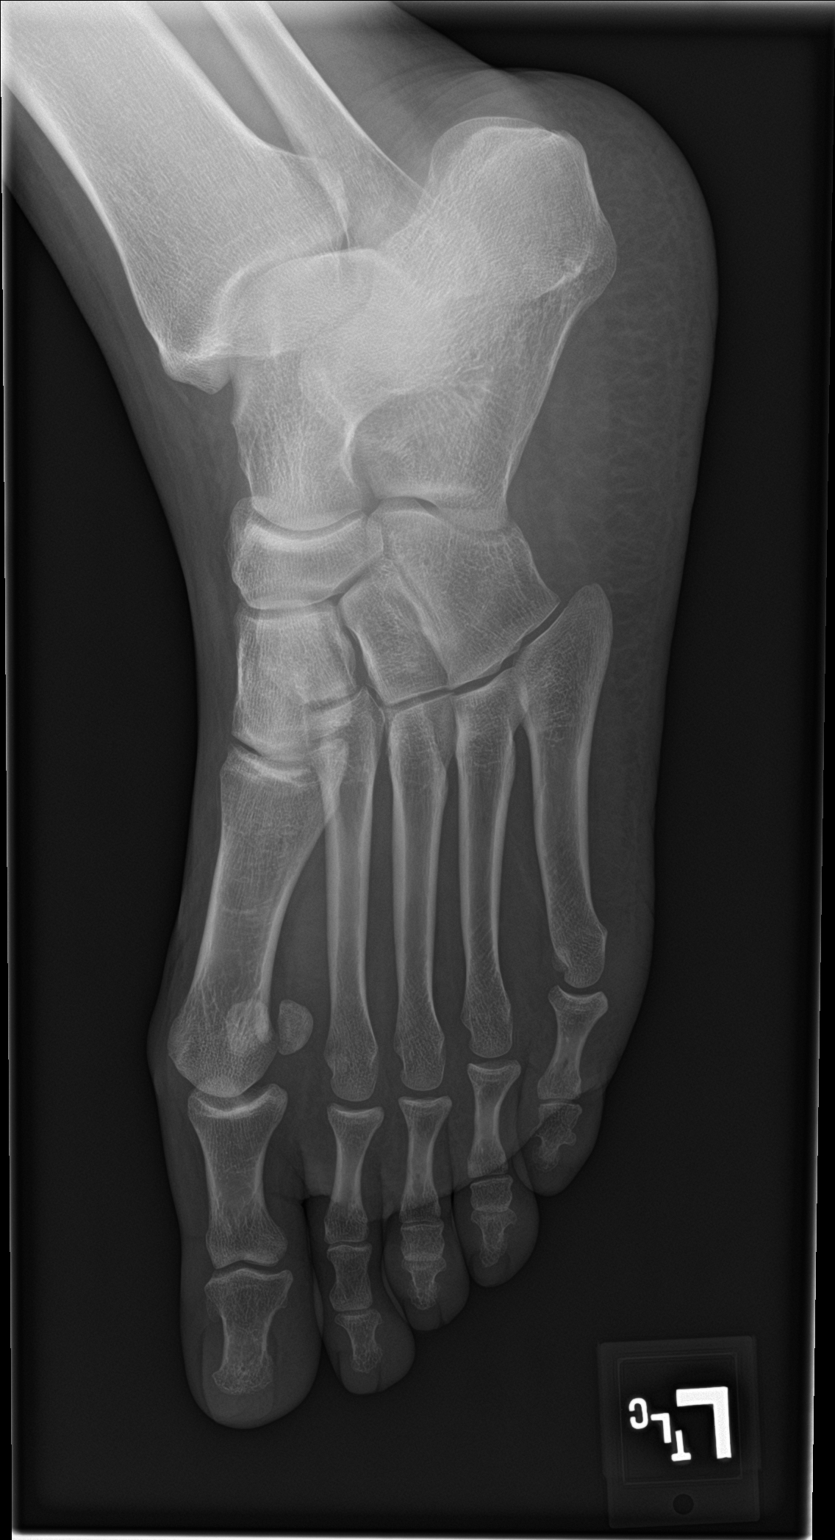

[foot lat]
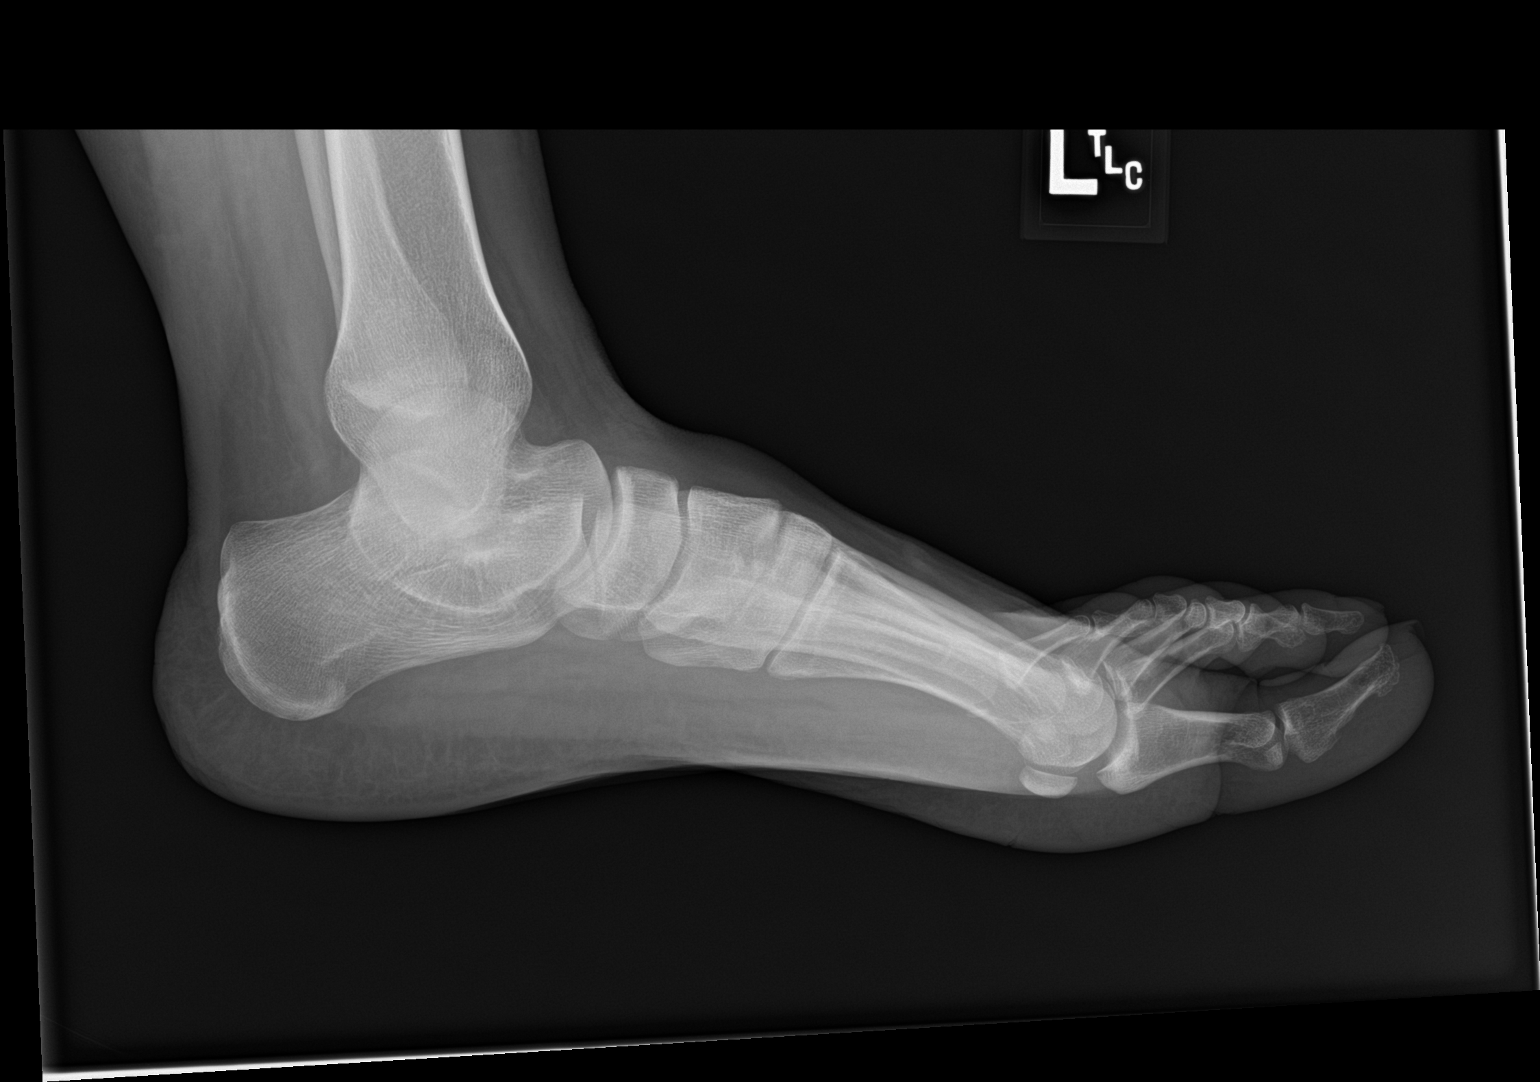

[3 of 3 positions shown; findings below may reference images not displayed]

FINDINGS: There is no evidence of fracture or dislocation. There is no
evidence of arthropathy or other focal bone abnormality. Mild soft
tissue edema in the mid dorsum of the foot.
IMPRESSION: Dorsal soft tissue edema.  No acute fracture.

## 2019-07-09 ENCOUNTER — Other Ambulatory Visit: Payer: Self-pay

## 2019-07-09 ENCOUNTER — Ambulatory Visit: Payer: Medicaid Other | Attending: Internal Medicine

## 2019-07-09 DIAGNOSIS — Z20822 Contact with and (suspected) exposure to covid-19: Secondary | ICD-10-CM

## 2019-07-10 LAB — NOVEL CORONAVIRUS, NAA: SARS-CoV-2, NAA: NOT DETECTED

## 2021-07-05 ENCOUNTER — Emergency Department (HOSPITAL_COMMUNITY): Payer: Self-pay

## 2021-07-05 ENCOUNTER — Encounter (HOSPITAL_COMMUNITY): Payer: Self-pay

## 2021-07-05 ENCOUNTER — Other Ambulatory Visit: Payer: Self-pay

## 2021-07-05 ENCOUNTER — Emergency Department (HOSPITAL_COMMUNITY)
Admission: EM | Admit: 2021-07-05 | Discharge: 2021-07-05 | Disposition: A | Discharge status: Home / Self Care | Payer: Self-pay | Attending: Emergency Medicine | Admitting: Emergency Medicine

## 2021-07-05 DIAGNOSIS — H6692 Otitis media, unspecified, left ear: Secondary | ICD-10-CM | POA: Insufficient documentation

## 2021-07-05 DIAGNOSIS — R079 Chest pain, unspecified: Secondary | ICD-10-CM | POA: Insufficient documentation

## 2021-07-05 DIAGNOSIS — Z20822 Contact with and (suspected) exposure to covid-19: Secondary | ICD-10-CM | POA: Insufficient documentation

## 2021-07-05 DIAGNOSIS — N9489 Other specified conditions associated with female genital organs and menstrual cycle: Secondary | ICD-10-CM | POA: Insufficient documentation

## 2021-07-05 DIAGNOSIS — J069 Acute upper respiratory infection, unspecified: Secondary | ICD-10-CM | POA: Insufficient documentation

## 2021-07-05 LAB — CBC
HCT: 29.2 % — ABNORMAL LOW (ref 36.0–46.0)
Hemoglobin: 8.6 g/dL — ABNORMAL LOW (ref 12.0–15.0)
MCH: 23.4 pg — ABNORMAL LOW (ref 26.0–34.0)
MCHC: 29.5 g/dL — ABNORMAL LOW (ref 30.0–36.0)
MCV: 79.6 fL — ABNORMAL LOW (ref 80.0–100.0)
Platelets: 661 10*3/uL — ABNORMAL HIGH (ref 150–400)
RBC: 3.67 MIL/uL — ABNORMAL LOW (ref 3.87–5.11)
RDW: 21.4 % — ABNORMAL HIGH (ref 11.5–15.5)
WBC: 15 10*3/uL — ABNORMAL HIGH (ref 4.0–10.5)
nRBC: 0 % (ref 0.0–0.2)

## 2021-07-05 LAB — BASIC METABOLIC PANEL
Anion gap: 11 (ref 5–15)
BUN: 7 mg/dL (ref 6–20)
CO2: 25 mmol/L (ref 22–32)
Calcium: 8.9 mg/dL (ref 8.9–10.3)
Chloride: 102 mmol/L (ref 98–111)
Creatinine, Ser: 0.66 mg/dL (ref 0.44–1.00)
GFR, Estimated: >60 mL/min (ref >60)
Glucose, Bld: 109 mg/dL — ABNORMAL HIGH (ref 70–99)
Potassium: 3.2 mmol/L — ABNORMAL LOW (ref 3.5–5.1)
Sodium: 138 mmol/L (ref 135–145)

## 2021-07-05 LAB — TROPONIN I (HIGH SENSITIVITY)
Troponin I (High Sensitivity): 4 ng/L (ref <18)
Troponin I (High Sensitivity): 3 ng/L (ref <18)

## 2021-07-05 LAB — I-STAT BETA HCG BLOOD, ED (MC, WL, AP ONLY): I-stat hCG, quantitative: <5 m[IU]/mL (ref <5)

## 2021-07-05 LAB — RESP PANEL BY RT-PCR (FLU A&B, COVID) ARPGX2
Influenza A by PCR: NEGATIVE
Influenza B by PCR: NEGATIVE
SARS Coronavirus 2 by RT PCR: NEGATIVE

## 2021-07-05 LAB — GROUP A STREP BY PCR: Group A Strep by PCR: NOT DETECTED

## 2021-07-05 MED ORDER — ACETAMINOPHEN 325 MG PO TABS: 650.0000 mg | ORAL_TABLET | Freq: Once | ORAL | Status: DC

## 2021-07-05 MED ORDER — POTASSIUM CHLORIDE CRYS ER 20 MEQ PO TBCR
40.0000 meq | EXTENDED_RELEASE_TABLET | Freq: Once | ORAL | Status: AC
  Administered 2021-07-05: 40 meq via ORAL
  Filled 2021-07-05: qty 2

## 2021-07-05 MED ORDER — KETOROLAC TROMETHAMINE 60 MG/2ML IM SOLN
30.0000 mg | Freq: Once | INTRAMUSCULAR | Status: AC
  Administered 2021-07-05: 30 mg via INTRAMUSCULAR
  Filled 2021-07-05: qty 2

## 2021-07-05 MED ORDER — LIDOCAINE VISCOUS HCL 2 % MT SOLN
15.0000 mL | Freq: Once | OROMUCOSAL | Status: AC
  Administered 2021-07-05: 15 mL via OROMUCOSAL
  Filled 2021-07-05: qty 15

## 2021-07-05 MED ORDER — ACETAMINOPHEN 500 MG PO TABS
1000.0000 mg | ORAL_TABLET | Freq: Once | ORAL | Status: AC
  Administered 2021-07-05: 1000 mg via ORAL
  Filled 2021-07-05: qty 2

## 2021-07-05 MED ORDER — AMOXICILLIN 500 MG PO CAPS
500.0000 mg | ORAL_CAPSULE | Freq: Two times a day (BID) | ORAL | 0 refills | Status: AC
Start: 2021-07-05 — End: ?

## 2021-07-05 MED ORDER — ACETAMINOPHEN 325 MG PO TABS
650.0000 mg | ORAL_TABLET | Freq: Once | ORAL | Status: AC
  Administered 2021-07-05: 650 mg via ORAL
  Filled 2021-07-05: qty 2

## 2021-07-05 MED ORDER — ACETAMINOPHEN 325 MG PO TABS
650.0000 mg | ORAL_TABLET | Freq: Once | ORAL | Status: DC
  Filled 2021-07-05: qty 2

## 2021-07-05 NOTE — ED Triage Notes (Signed)
Pt states that she she been having generalized body aches, sore throat, SOB, CP and fevers since yesterday.

## 2021-07-05 NOTE — ED Notes (Signed)
Pt verbalized understanding of d/c instructions, meds and followup care. Denies questions. VSS, no distress noted. Steady gait to exit with all belongings.  ?

## 2021-07-05 NOTE — Discharge Instructions (Addendum)
Your symptoms are likely caused by a viral upper respiratory infection. Antibiotics are not helpful in treating viral infection, the virus should run its course in about 7 days. Please make sure you are drinking plenty of fluids. You can treat your symptoms supportively with tylenol 1000 mg and ibuprofen 800 mg every 8 hours for fevers and pains, Zyrtec and Flonase to help with nasal congestion, and over the counter cough syrups and throat lozenges to help with cough. Cepacol throat lozenges to help with sore throat. If your symptoms are not improving please follow up with you Primary doctor.   Take antibiotics twice daily with food for left ear infection.  If you have persistent symptoms in 3 to 4 days consider getting a repeat COVID and flu test via home testing or urgent care.  If you develop persistent fevers, shortness of breath or difficulty breathing, chest pain, severe headache and neck pain, persistent nausea and vomiting or other new or concerning symptoms return to the Emergency department.

## 2021-07-05 NOTE — ED Provider Notes (Signed)
[facility] EMERGENCY DEPARTMENT Provider Note   CSN: [id number] Arrival date & time: 07/05/21  0559     History  Chief Complaint  Patient presents with   Sore Throat    Angie Key is a 41 y.o. female.  Angie Key is a 41 y.o. female who is otherwise healthy, who presents to the emergency department for evaluation of fevers, chills, body aches, sore throat and chest pain.  Symptoms started yesterday.  She reports when she woke up her throat felt scratchy but she did not think much of it but as the day went on she started feeling much worse with associated body aches, chills, nasal congestion, sinus pressure and she reports having some chest pain.  She denies having much cough.  No shortness of breath.  Unsure of sick contacts.  Took ibuprofen yesterday for symptoms and has not tried anything else to treat symptoms.  The history is provided by the patient.      Home Medications Prior to Admission medications   Medication Sig Start Date End Date Taking? Authorizing Provider  amoxicillin (AMOXIL) 500 MG capsule Take 1 capsule (500 mg total) by mouth 2 (two) times daily. 07/05/21  Yes Dartha Lodge, PA-C  lidocaine (XYLOCAINE) 2 % solution Gargle and spit or swallow 10 cc every 3 hours as needed for pain 06/14/18   Janne Napoleon, NP      Allergies    Bee venom and Coconut flavor    Review of Systems   Review of Systems  Constitutional:  Positive for chills and fever.  HENT:  Positive for congestion, rhinorrhea and sore throat.   Respiratory:  Positive for cough. Negative for shortness of breath.   Cardiovascular:  Positive for chest pain.  Gastrointestinal:  Negative for abdominal pain, diarrhea, nausea and vomiting.  Musculoskeletal:  Positive for myalgias. Negative for neck pain and neck stiffness.  Skin:  Negative for color change and rash.  Neurological:  Positive for headaches.  All other systems reviewed and are negative.  Physical Exam Updated  Vital Signs BP 123/84 (BP Location: Left Arm)    Pulse 92    Temp 99.4 F (37.4 C) (Oral)    Resp 16    SpO2 97%  Physical Exam Vitals and nursing note reviewed.  Constitutional:      General: She is not in acute distress.    Appearance: She is well-developed. She is not ill-appearing or diaphoretic.  HENT:     Head: Normocephalic and atraumatic.     Right Ear: Tympanic membrane is erythematous and bulging.     Nose: Rhinorrhea present.     Mouth/Throat:     Mouth: Mucous membranes are moist.     Pharynx: Oropharynx is clear.     Comments: Posterior oropharynx clear and mucous membranes moist, there is mild erythema but no edema or tonsillar exudates, uvula midline, normal phonation, no trismus, tolerating secretions without difficulty. Eyes:     General:        Right eye: No discharge.        Left eye: No discharge.  Neck:     Comments: No rigidity Cardiovascular:     Rate and Rhythm: Normal rate and regular rhythm.     Heart sounds: Normal heart sounds. No murmur heard.   No friction rub. No gallop.  Pulmonary:     Effort: Pulmonary effort is normal. No respiratory distress.     Breath sounds: Normal breath sounds.  Comments: Respirations equal and unlabored, patient able to speak in full sentences, lungs clear to auscultation bilaterally  Chest:     Chest wall: Tenderness present.     Comments: Mild tenderness to anterior chest wall on palpation, no overlying skin changes or palpable deformity Abdominal:     General: Bowel sounds are normal. There is no distension.     Palpations: Abdomen is soft. There is no mass.     Tenderness: There is no abdominal tenderness. There is no guarding.     Comments: Abdomen soft, nondistended, nontender to palpation in all quadrants without guarding or peritoneal signs  Musculoskeletal:        General: No deformity.     Cervical back: Neck supple.  Lymphadenopathy:     Cervical: No cervical adenopathy.  Skin:    General: Skin is warm  and dry.     Capillary Refill: Capillary refill takes less than 2 seconds.  Neurological:     Mental Status: She is alert and oriented to person, place, and time.  Psychiatric:        Mood and Affect: Mood normal.        Behavior: Behavior normal.    ED Results / Procedures / Treatments   Labs (all labs ordered are listed, but only abnormal results are displayed) Labs Reviewed  BASIC METABOLIC PANEL - Abnormal; Notable for the following components:      Result Value   Potassium 3.2 (*)    Glucose, Bld 109 (*)    All other components within normal limits  CBC - Abnormal; Notable for the following components:   WBC 15.0 (*)    RBC 3.67 (*)    Hemoglobin 8.6 (*)    HCT 29.2 (*)    MCV 79.6 (*)    MCH 23.4 (*)    MCHC 29.5 (*)    RDW 21.4 (*)    Platelets 661 (*)    All other components within normal limits  GROUP A STREP BY PCR  RESP PANEL BY RT-PCR (FLU A&B, COVID) ARPGX2  I-STAT BETA HCG BLOOD, ED ([facility], [facility], [facility] ONLY)  TROPONIN I (HIGH SENSITIVITY)  TROPONIN I (HIGH SENSITIVITY)    EKG None  Radiology DG Chest 2 View  Result Date: 07/05/2021 CLINICAL DATA:  41 year old female with history of chest pain. EXAM: CHEST - 2 VIEW COMPARISON:  Chest x-ray 07/17/2017. FINDINGS: Lung volumes are normal. No consolidative airspace disease. No pleural effusions. No pneumothorax. No pulmonary nodule or mass noted. Pulmonary vasculature and the cardiomediastinal silhouette are within normal limits. IMPRESSION: No radiographic evidence of acute cardiopulmonary disease. Electronically Signed   By: Trudie Reedaniel  Entrikin M.D.   On: 07/05/2021 06:46    Procedures Procedures    Medications Ordered in ED Medications  acetaminophen (TYLENOL) tablet 650 mg (650 mg Oral Given 07/05/21 0620)  ketorolac (TORADOL) injection 30 mg (30 mg Intramuscular Given 07/05/21 1019)  acetaminophen (TYLENOL) tablet 1,000 mg (1,000 mg Oral Given 07/05/21 1152)  lidocaine (XYLOCAINE) 2 % viscous mouth solution 15 mL  (15 mLs Mouth/Throat Given 07/05/21 1152)  potassium chloride SA (KLOR-CON M) CR tablet 40 mEq (40 mEq Oral Given 07/05/21 1152)    ED Course/ Medical Decision Making/ A&P                           41 year old woman presents with 2 days of fever, chills, sore throat, body aches and chest discomfort.  On exam she is well-appearing, initially  febrile but this resolved with treatment.  Lungs are clear, mild chest tenderness on palpation.  She does have signs of otitis on exam on the left, throat is mildly erythematous but clear.  Differential includes viral URI, COVID, flu, pneumonia, musculoskeletal chest pain or costochondritis, otitis media, less concerning for ACS or PE.  Additional history obtained from chart review.  Outside records obtained and reviewed   I ordered Tylenol, Toradol, and viscous lidocaine for symptomatic treatment  Lab Tests:  I Ordered, reviewed, and interpreted labs, which included:  CBC: Mild leukocytosis, hemoglobin is at baseline with chronic anemia CMP: Mild hypokalemia of 3.2, p.o. potassium replacement given, no other significant electrolyte derangements Troponin: Negative x2 COVID/flu: Negative Strep PCR: Negative Pregnancy: Negative  Imaging Studies ordered:  I ordered imaging studies which included chest x-ray, I independently visualized and interpreted imaging which showed no active cardiopulmonary disease.  Agree with radiologist findings  ED Course:   Work-up today has been reassuring, and suggestive of likely viral URI.  COVID and flu testing are negative today but patient is within first 24 hours of symptoms, recommend that she get repeat testing if symptoms are persisting in 3 to 4 days.  Also discussed that this could be due to other viral URI.  Patient does have evidence of otitis on the left so will treat with amoxicillin.  EKG and troponins are reassuring and chest pain seems to be present with palpation, suspect this is more so due to musculoskeletal  pain with URI.  Patient's symptoms treated and improved here in the ED, tolerating p.o.  Will discharge home with continued supportive treatment and antibiotics and discussed PCP follow-up and return precautions.  Discharged home in good condition.    Portions of this note were generated with Scientist, clinical (histocompatibility and immunogenetics). Dictation errors may occur despite best attempts at proofreading.         Final Clinical Impression(s) / ED Diagnoses Final diagnoses:  Upper respiratory tract infection, unspecified type  Left otitis media, unspecified otitis media type    Rx / DC Orders ED Discharge Orders          Ordered    amoxicillin (AMOXIL) 500 MG capsule  2 times daily        07/05/21 1342              Jodi Geralds [place], [place] 07/05/21 1403    Tegeler, Canary Brim, MD 07/05/21 1452

## 2021-12-20 ENCOUNTER — Emergency Department (HOSPITAL_COMMUNITY)
Admission: EM | Admit: 2021-12-20 | Discharge: 2021-12-20 | Disposition: A | Discharge status: Home / Self Care | Payer: Self-pay | Attending: Emergency Medicine | Admitting: Emergency Medicine

## 2021-12-20 ENCOUNTER — Encounter (HOSPITAL_COMMUNITY): Payer: Self-pay | Admitting: Emergency Medicine

## 2021-12-20 ENCOUNTER — Emergency Department (HOSPITAL_COMMUNITY): Payer: Self-pay

## 2021-12-20 DIAGNOSIS — Y92002 Bathroom of unspecified non-institutional (private) residence single-family (private) house as the place of occurrence of the external cause: Secondary | ICD-10-CM | POA: Insufficient documentation

## 2021-12-20 DIAGNOSIS — S62630A Displaced fracture of distal phalanx of right index finger, initial encounter for closed fracture: Secondary | ICD-10-CM | POA: Insufficient documentation

## 2021-12-20 DIAGNOSIS — S6710XA Crushing injury of unspecified finger(s), initial encounter: Secondary | ICD-10-CM

## 2021-12-20 DIAGNOSIS — S62639A Displaced fracture of distal phalanx of unspecified finger, initial encounter for closed fracture: Secondary | ICD-10-CM

## 2021-12-20 DIAGNOSIS — W230XXA Caught, crushed, jammed, or pinched between moving objects, initial encounter: Secondary | ICD-10-CM | POA: Insufficient documentation

## 2021-12-20 MED ORDER — OXYCODONE HCL 5 MG PO TABS: 5.0000 mg | ORAL_TABLET | Freq: Four times a day (QID) | ORAL | 0 refills | Status: AC | PRN

## 2021-12-20 MED ORDER — ACETAMINOPHEN 325 MG PO TABS: 650.0000 mg | ORAL_TABLET | Freq: Four times a day (QID) | ORAL | 0 refills | Status: AC | PRN

## 2021-12-20 MED ORDER — IBUPROFEN 600 MG PO TABS: 600.0000 mg | ORAL_TABLET | Freq: Four times a day (QID) | ORAL | 0 refills | Status: AC | PRN

## 2021-12-20 NOTE — ED Provider Notes (Signed)
[facility] EMERGENCY DEPARTMENT Provider Note   CSN: [id number] Arrival date & time: 12/20/21  0086     History  Chief Complaint  Patient presents with   Finger Injury    Angie Key is a 41 y.o. female presenting to the ED with finger injury.  She accidentally slammed her right second finger in a door crack yesterday, had a crush injury to the end of her finger.  She is right-handed.  She works predominantly typing.  She denies any other injuries.  HPI     Home Medications Prior to Admission medications   Medication Sig Start Date End Date Taking? Authorizing Provider  amoxicillin (AMOXIL) 500 MG capsule Take 1 capsule (500 mg total) by mouth 2 (two) times daily. 07/05/21   Dartha Lodge, PA-C  lidocaine (XYLOCAINE) 2 % solution Gargle and spit or swallow 10 cc every 3 hours as needed for pain 06/14/18   Kerrie Buffalo M, NP      Allergies    Bee venom and Coconut flavor    Review of Systems   Review of Systems  Physical Exam Updated Vital Signs BP (!) 167/99 (BP Location: Left Arm)   Pulse 91   Temp 98.2 F (36.8 C) (Oral)   Resp 16   SpO2 100%  Physical Exam Constitutional:      General: She is not in acute distress. HENT:     Head: Normocephalic and atraumatic.  Cardiovascular:     Rate and Rhythm: Normal rate and regular rhythm.  Pulmonary:     Effort: No respiratory distress.  Musculoskeletal:     Comments: Tenderness, swelling of the right distal fingertip.  Patient is able to flex and extend fingers  Skin:    General: Skin is warm and dry.  Neurological:     General: No focal deficit present.     Mental Status: She is alert and oriented to person, place, and time. Mental status is at baseline.  Psychiatric:        Mood and Affect: Mood normal.        Behavior: Behavior normal.     ED Results / Procedures / Treatments   Labs (all labs ordered are listed, but only abnormal results are displayed) Labs Reviewed - No data to  display  EKG None  Radiology DG Hand Complete Right  Result Date: 12/20/2021 CLINICAL DATA:  A 41 year old female presents with history of trauma. EXAM: RIGHT HAND - COMPLETE 3+ VIEW COMPARISON:  None available FINDINGS: Soft tissue swelling about the index finger. Mildly comminuted tuft fracture of the distal phalanx of the index finger. No additional fractures or sign of dislocation. IMPRESSION: 1. Mildly comminuted tuft fracture of the distal phalanx of the right index finger. 2. Soft tissue swelling about the index finger. Electronically Signed   By: Donzetta Kohut M.D.   On: 12/20/2021 08:35    Procedures Procedures    Medications Ordered in ED Medications - No data to display  ED Course/ Medical Decision Making/ A&P                           Medical Decision Making Amount and/or Complexity of Data Reviewed Radiology: ordered.   Patient is here with an isolated injury, crush injury of the right distal finger.  No tendon strength appears grossly intact.  X-rays ordered and personally reviewed and interpreted, showing a tuft fracture of the right distal fingertip, second finger.  No  other injuries noted on exam.  She will be placed in a finger splint, and given that this is her predominant hand and her working hand, I did advise follow-up with a hand surgeon to ensure appropriate healing.  She verbalized understanding and agreement with the plan.        Final Clinical Impression(s) / ED Diagnoses Final diagnoses:  Crushing injury of finger, initial encounter  Closed fracture of tuft of distal phalanx of finger    Rx / DC Orders ED Discharge Orders     None         Terald Sleeper, MD 12/20/21 [phone]

## 2021-12-20 NOTE — Progress Notes (Signed)
Orthopedic Tech Progress Note Patient Details:  Angie Key Oct 23, 1980 [id number]  Ortho Devices Type of Ortho Device: Finger splint Ortho Device/Splint Location: RUE Ortho Device/Splint Interventions: Application   Post Interventions Patient Tolerated: Well  Genelle Bal Breland Trouten 12/20/2021, 9:47 AM

## 2021-12-20 NOTE — Discharge Instructions (Addendum)
You should wear your finger splint at all times, except when you are showering, and then replace it afterwards.  You should not use your finger for any type of activities, gripping or typing.  You should follow-up with a hand surgeon in 1 to 2 weeks, either with your own surgeon of preference or with the surgeon provided.  This is to ensure that your wound is healing correctly.  For pain you can take Tylenol and ibuprofen as prescribed at baseline for the next 7 days.  For more severe breakthrough pain you can take oxycodone as needed.  Do not drive or operate machinery after taking oxycodone, as this is a narcotic.

## 2021-12-20 NOTE — ED Triage Notes (Signed)
Patient here for evaluation of right finger injury sustained after closing her bathroom door on right right index finger. Finger is red, swollen, and painful.

## 2022-01-08 ENCOUNTER — Ambulatory Visit (INDEPENDENT_AMBULATORY_CARE_PROVIDER_SITE_OTHER): Payer: Commercial Managed Care - HMO

## 2022-01-08 ENCOUNTER — Ambulatory Visit (INDEPENDENT_AMBULATORY_CARE_PROVIDER_SITE_OTHER): Payer: Commercial Managed Care - HMO | Admitting: Physician Assistant

## 2022-01-08 ENCOUNTER — Encounter: Payer: Self-pay | Admitting: Physician Assistant

## 2022-01-08 DIAGNOSIS — M79644 Pain in right finger(s): Secondary | ICD-10-CM | POA: Diagnosis not present

## 2022-01-08 DIAGNOSIS — S62660A Nondisplaced fracture of distal phalanx of right index finger, initial encounter for closed fracture: Secondary | ICD-10-CM

## 2022-01-08 DIAGNOSIS — S62630A Displaced fracture of distal phalanx of right index finger, initial encounter for closed fracture: Secondary | ICD-10-CM | POA: Insufficient documentation

## 2022-01-08 NOTE — Progress Notes (Signed)
Office Visit Note   Patient: Angie Key           Date of Birth: 1980/09/09           MRN: [id number] Visit Date: 01/08/2022              Requested by: Kathreen Cosier, MD [address] [place],  [place] [address] PCP: Kathreen Cosier, MD  Chief Complaint  Patient presents with   Right Index Finger - Injury      HPI: Patient is a pleasant 41 year old woman who is 2-1/2 weeks status post getting her hand stuck when she closed a door.  Impact of the injury went to the end of her right index finger.  She had immediate pain and swelling.  She was seen and evaluated in the emergency room urgent care where radiographs demonstrated a comminuted but stable fracture of the distal phalanx with crush injury.  She was placed in a splint.  She did not think there was any particular nail injury at the time it happened.  She is wearing her splint today.  She is otherwise healthy not diabetic  Assessment & Plan: Visit Diagnoses:  1. Pain in right finger(s)     Plan: New splint was given to her today.  She should continue to use the splint to protect the finger or if it is hurting for the next couple weeks.  She may use vitamin E oil on the surface of the skin to soften some of the callusing.  Did discuss with her that she may lose the nail and it may have some deformity if so and what it grows back.  Follow-Up Instructions: If still significant difficulties in 2 weeks  Ortho Exam  Patient is alert, oriented, no adenopathy, well-dressed, normal affect, normal respiratory effort. Examination she has a strong radial pulse.  Her index finger has some soft tissue swelling but it is warm and pink.  She is currently has an artificial nail over her nail.  She is tender over the fingertip.  But has good capillary refill.  Tip of finger is warm  Imaging: XR Finger Index Right  Result Date: 01/08/2022 Radiographs of her right index finger were obtained today.  She has some soft tissue swelling.   She has a crush injury to the distal phalanx with associated fracture in good and stable position  No images are attached to the encounter.  Labs: No results found for: "HGBA1C", "ESRSEDRATE", "CRP", "LABURIC", "REPTSTATUS", "GRAMSTAIN", "CULT", "LABORGA"   No results found for: "ALBUMIN", "PREALBUMIN", "CBC"  No results found for: "MG" No results found for: "VD25OH"  No results found for: "PREALBUMIN"    Latest Ref Rng & Units 07/05/2021    6:32 AM 05/18/2018    8:27 PM 05/17/2018    7:43 PM  CBC EXTENDED  WBC 4.0 - 10.5 K/uL 15.0  11.1    RBC 3.87 - 5.11 MIL/uL 3.67  3.71    Hemoglobin 12.0 - 15.0 g/dL 8.6  9.6  9.2   HCT 51.7 - 46.0 % 29.2  33.4  27.0   Platelets 150 - 400 K/uL 661  719    NEUT# 1.7 - 7.7 K/uL  6.2    Lymph# 0.7 - 4.0 K/uL  4.1       There is no height or weight on file to calculate BMI.  Orders:  Orders Placed This Encounter  Procedures   XR Finger Index Right   No orders of the defined  types were placed in this encounter.    Procedures: No procedures performed  Clinical Data: No additional findings.  ROS:  All other systems negative, except as noted in the HPI. Review of Systems  Objective: Vital Signs: There were no vitals taken for this visit.  Specialty Comments:  No specialty comments available.  PMFS History: Patient Active Problem List   Diagnosis Date Noted   Fracture of distal phalanx of right index finger 01/08/2022   No past medical history on file.  Family History  Problem Relation Age of Onset   Hypertension Mother    Aneurysm Mother    Heart failure Mother    Hypertension Father    Gout Father     Past Surgical History:  Procedure Laterality Date   TUBAL LIGATION     Social History   Occupational History   Not on file  Tobacco Use   Smoking status: Never   Smokeless tobacco: Never  Substance and Sexual Activity   Alcohol use: Yes    Comment: weekends   Drug use: No   Sexual activity: Yes    Birth  control/protection: None

## 2022-01-30 ENCOUNTER — Other Ambulatory Visit: Payer: Self-pay | Admitting: Obstetrics

## 2022-01-30 DIAGNOSIS — Z1231 Encounter for screening mammogram for malignant neoplasm of breast: Secondary | ICD-10-CM

## 2022-02-19 ENCOUNTER — Ambulatory Visit: Payer: Commercial Managed Care - HMO

## 2022-02-27 ENCOUNTER — Ambulatory Visit
Admission: EM | Admit: 2022-02-27 | Discharge: 2022-02-27 | Disposition: A | Discharge status: Home / Self Care | Payer: Commercial Managed Care - HMO | Attending: Urgent Care | Admitting: Urgent Care

## 2022-02-27 DIAGNOSIS — R0982 Postnasal drip: Secondary | ICD-10-CM | POA: Insufficient documentation

## 2022-02-27 DIAGNOSIS — J069 Acute upper respiratory infection, unspecified: Secondary | ICD-10-CM | POA: Insufficient documentation

## 2022-02-27 DIAGNOSIS — Z20822 Contact with and (suspected) exposure to covid-19: Secondary | ICD-10-CM | POA: Insufficient documentation

## 2022-02-27 DIAGNOSIS — R070 Pain in throat: Secondary | ICD-10-CM | POA: Insufficient documentation

## 2022-02-27 LAB — POCT RAPID STREP A (OFFICE): Rapid Strep A Screen: NEGATIVE

## 2022-02-27 MED ORDER — CETIRIZINE HCL 10 MG PO TABS: 10.0000 mg | ORAL_TABLET | Freq: Every day | ORAL | 0 refills | Status: AC

## 2022-02-27 MED ORDER — PSEUDOEPHEDRINE HCL 60 MG PO TABS: 60.0000 mg | ORAL_TABLET | Freq: Three times a day (TID) | ORAL | 0 refills | Status: AC | PRN

## 2022-02-27 NOTE — ED Provider Notes (Signed)
[facility]  Note:  This document was prepared using Conservation officer, historic buildings and may include unintentional dictation errors.  MRN: [id number] DOB: 11-12-1980  Subjective:   Angie Key is a 41 y.o. female presenting for 1 day history of acute onset persistent worsening itchy throat, scratchy throat, throat pain.  She is also had a runny and stuffy nose.  Had 1 sick contact over this past weekend with her granddaughter who had very similar symptoms.  No cough, chest pain, shortness of breath or wheezing.  Patient would like to make sure she does not have an infection.   Current Facility-Administered Medications:    triamcinolone acetonide (KENALOG) 10 MG/ML injection 10 mg, 10 mg, Other, Once, Stover, Titorya, DPM   triamcinolone acetonide (KENALOG) 10 MG/ML injection 10 mg, 10 mg, Other, Once, Asencion Islam, DPM  Current Outpatient Medications:    acetaminophen (TYLENOL) 325 MG tablet, Take 2 tablets (650 mg total) by mouth every 6 (six) hours as needed for up to 30 doses for moderate pain or mild pain., Disp: 30 tablet, Rfl: 0   amoxicillin (AMOXIL) 500 MG capsule, Take 1 capsule (500 mg total) by mouth 2 (two) times daily., Disp: 14 capsule, Rfl: 0   ibuprofen (ADVIL) 600 MG tablet, Take 1 tablet (600 mg total) by mouth every 6 (six) hours as needed for up to 30 doses for mild pain or moderate pain., Disp: 30 tablet, Rfl: 0   lidocaine (XYLOCAINE) 2 % solution, Gargle and spit or swallow 10 cc every 3 hours as needed for pain, Disp: 100 mL, Rfl: 0   oxyCODONE (ROXICODONE) 5 MG immediate release tablet, Take 1 tablet (5 mg total) by mouth every 6 (six) hours as needed for up to 10 doses for severe pain., Disp: 10 tablet, Rfl: 0   Allergies  Allergen Reactions   Bee Venom Swelling   Coconut Flavor Swelling    No past medical history on file.   Past Surgical History:  Procedure Laterality Date   TUBAL LIGATION      Family History  Problem  Relation Age of Onset   Hypertension Mother    Aneurysm Mother    Heart failure Mother    Hypertension Father    Gout Father     Social History   Tobacco Use   Smoking status: Never   Smokeless tobacco: Never  Substance Use Topics   Alcohol use: Yes    Comment: weekends   Drug use: No    ROS   Objective:   Vitals: BP (!) 171/106 (BP Location: Left Arm)   Pulse 74   Temp 98.3 F (36.8 C) (Oral)   Resp 17   LMP 02/27/2022   SpO2 100%   Physical Exam Constitutional:      General: She is not in acute distress.    Appearance: Normal appearance. She is well-developed and normal weight. She is not ill-appearing, toxic-appearing or diaphoretic.  HENT:     Head: Normocephalic and atraumatic.     Right Ear: Tympanic membrane, ear canal and external ear normal. No drainage or tenderness. No middle ear effusion. There is no impacted cerumen. Tympanic membrane is not erythematous.     Left Ear: Tympanic membrane, ear canal and external ear normal. No drainage or tenderness.  No middle ear effusion. There is no impacted cerumen. Tympanic membrane is not erythematous.     Nose: No congestion or rhinorrhea.     Mouth/Throat:     Mouth: Mucous  membranes are moist. No oral lesions.     Pharynx: No pharyngeal swelling, oropharyngeal exudate, posterior oropharyngeal erythema or uvula swelling.     Tonsils: No tonsillar exudate or tonsillar abscesses.     Comments: Thick post nasal drainage overlying the pharynx. Eyes:     General: No scleral icterus.       Right eye: No discharge.        Left eye: No discharge.     Extraocular Movements: Extraocular movements intact.     Right eye: Normal extraocular motion.     Left eye: Normal extraocular motion.     Conjunctiva/sclera: Conjunctivae normal.  Cardiovascular:     Rate and Rhythm: Normal rate.  Pulmonary:     Effort: Pulmonary effort is normal.  Musculoskeletal:     Cervical back: Normal range of motion and neck supple.   Lymphadenopathy:     Cervical: No cervical adenopathy.  Skin:    General: Skin is warm and dry.  Neurological:     General: No focal deficit present.     Mental Status: She is alert and oriented to person, place, and time.  Psychiatric:        Mood and Affect: Mood normal.        Behavior: Behavior normal.     Results for orders placed or performed during the hospital encounter of 02/27/22 (from the past 24 hour(s))  POCT rapid strep A     Status: None   Collection Time: 02/27/22 11:25 AM  Result Value Ref Range   Rapid Strep A Screen Negative Negative    Assessment and Plan :   PDMP not reviewed this encounter.  1. Viral upper respiratory infection   2. Post-nasal drainage   3. Throat pain    Strep culture pending.  Recommend supportive care for an acute viral pharyngitis. Counseled patient on potential for adverse effects with medications prescribed/recommended today, ER and return-to-clinic precautions discussed, patient verbalized understanding.    Jaynee Eagles, PA-C 02/27/22 1247

## 2022-02-27 NOTE — Discharge Instructions (Addendum)
We will notify you of your test results as they arrive and may take between about 24 hours for the COVID test and 2-3 days for the strep culture.  I encourage you to sign up for MyChart if you have not already done so as this can be the easiest way for [place] to communicate results to you online or through a phone app.  Generally, we only contact you if it is a positive test result.  In the meantime, if you develop worsening symptoms including fever, chest pain, shortness of breath despite our current treatment plan then please report to the emergency room as this may be a sign of worsening status from possible viral infection.  Otherwise, we will manage this as a viral syndrome. For sore throat or cough try using a honey-based tea. Use 3 teaspoons of honey with juice squeezed from half lemon. Place shaved pieces of ginger into 1/2-1 cup of water and warm over stove top. Then mix the ingredients and repeat every 4 hours as needed. Please take Tylenol 500mg -650mg  every 6 hours for aches and pains, fevers. Hydrate very well with at least 2 liters of water. Eat light meals such as soups to replenish electrolytes and soft fruits, veggies. Start an antihistamine like Zyrtec for postnasal drainage, sinus congestion.  You can take this together with pseudoephedrine (Sudafed) at a dose of 60 mg 2-3 times a day as needed for the same kind of congestion.

## 2022-02-27 NOTE — ED Triage Notes (Signed)
Pt c/o Itchy, sore throat; tender to touch. Start yesterday and has worsened

## 2022-02-28 LAB — SARS CORONAVIRUS 2 (TAT 6-24 HRS): SARS Coronavirus 2: NEGATIVE

## 2022-03-01 LAB — CULTURE, GROUP A STREP (THRC)

## 2023-01-02 ENCOUNTER — Encounter (HOSPITAL_COMMUNITY): Payer: Self-pay

## 2023-01-02 ENCOUNTER — Emergency Department (HOSPITAL_COMMUNITY)
Admission: EM | Admit: 2023-01-02 | Discharge: 2023-01-02 | Disposition: A | Discharge status: Home / Self Care | Payer: No Typology Code available for payment source | Attending: Emergency Medicine | Admitting: Emergency Medicine

## 2023-01-02 DIAGNOSIS — R0981 Nasal congestion: Secondary | ICD-10-CM | POA: Insufficient documentation

## 2023-01-02 DIAGNOSIS — R519 Headache, unspecified: Secondary | ICD-10-CM | POA: Diagnosis not present

## 2023-01-02 DIAGNOSIS — Z20822 Contact with and (suspected) exposure to covid-19: Secondary | ICD-10-CM | POA: Insufficient documentation

## 2023-01-02 DIAGNOSIS — J029 Acute pharyngitis, unspecified: Secondary | ICD-10-CM | POA: Insufficient documentation

## 2023-01-02 LAB — SARS CORONAVIRUS 2 BY RT PCR: SARS Coronavirus 2 by RT PCR: NEGATIVE

## 2023-01-02 LAB — GROUP A STREP BY PCR: Group A Strep by PCR: NOT DETECTED

## 2023-01-02 MED ORDER — BENZONATATE 100 MG PO CAPS: 100.0000 mg | ORAL_CAPSULE | Freq: Three times a day (TID) | ORAL | 0 refills | Status: AC

## 2023-01-02 MED ORDER — ACETAMINOPHEN 325 MG PO TABS
650.0000 mg | ORAL_TABLET | Freq: Once | ORAL | Status: AC
  Administered 2023-01-02: 650 mg via ORAL
  Filled 2023-01-02: qty 2

## 2023-01-02 NOTE — Discharge Instructions (Addendum)
You may take over-the-counter medicine for symptomatic relief, such as Tylenol, Motrin, TheraFlu, Alka seltzer , black elderberry, etc. Please limit acetaminophen (Tylenol) to 4000 mg and Ibuprofen (Motrin, Advil, etc.) to 2400 mg for a 24hr period. Please note that other over-the-counter medicine may contain acetaminophen or ibuprofen as a component of their ingredients.   

## 2023-01-02 NOTE — ED Triage Notes (Signed)
Pt states that she began to have a sore throat yesterday with headache and sinus pressure

## 2023-01-02 NOTE — ED Provider Notes (Signed)
[place] EMERGENCY DEPARTMENT AT [facility] Provider Note  CSN: [id number] Arrival date & time: 01/02/23 0153  Chief Complaint(s) No chief complaint on file.  HPI Angie Key is a 42 y.o. female    The history is provided by the patient.  Sore Throat This is a new problem. The current episode started 2 days ago. The problem occurs constantly. The problem has not changed since onset.Associated symptoms include headaches. Pertinent negatives include no chest pain, no abdominal pain and no shortness of breath. Nothing aggravates the symptoms. Nothing relieves the symptoms. Treatments tried: OTC. The treatment provided mild relief.    Past Medical History History reviewed. No pertinent past medical history. Patient Active Problem List   Diagnosis Date Noted   Fracture of distal phalanx of right index finger 01/08/2022   Home Medication(s) Prior to Admission medications   Medication Sig Start Date End Date Taking? Authorizing Provider  benzonatate (TESSALON) 100 MG capsule Take 1 capsule (100 mg total) by mouth every 8 (eight) hours. 01/02/23  Yes Jamar Casagrande, Amadeo Garnet, MD  acetaminophen (TYLENOL) 325 MG tablet Take 2 tablets (650 mg total) by mouth every 6 (six) hours as needed for up to 30 doses for moderate pain or mild pain. 12/20/21   Terald Sleeper, MD  amoxicillin (AMOXIL) 500 MG capsule Take 1 capsule (500 mg total) by mouth 2 (two) times daily. 07/05/21   Dartha Lodge, PA-C  cetirizine (ZYRTEC ALLERGY) 10 MG tablet Take 1 tablet (10 mg total) by mouth daily. 02/27/22   Wallis Bamberg, PA-C  ibuprofen (ADVIL) 600 MG tablet Take 1 tablet (600 mg total) by mouth every 6 (six) hours as needed for up to 30 doses for mild pain or moderate pain. 12/20/21   Terald Sleeper, MD  lidocaine (XYLOCAINE) 2 % solution Gargle and spit or swallow 10 cc every 3 hours as needed for pain 06/14/18   Kerrie Buffalo M, NP  oxyCODONE (ROXICODONE) 5 MG immediate release tablet Take 1 tablet (5  mg total) by mouth every 6 (six) hours as needed for up to 10 doses for severe pain. 12/20/21   Terald Sleeper, MD  pseudoephedrine (SUDAFED) 60 MG tablet Take 1 tablet (60 mg total) by mouth every 8 (eight) hours as needed for congestion. 02/27/22   Wallis Bamberg, PA-C                                                                                                                                    Allergies Bee venom and Coconut flavor  Review of Systems Review of Systems  Constitutional:  Negative for chills and fever.  HENT:  Positive for congestion, postnasal drip and rhinorrhea.   Respiratory:  Negative for shortness of breath.   Cardiovascular:  Negative for chest pain.  Gastrointestinal:  Negative for abdominal pain.  Musculoskeletal:  Positive for myalgias.  Neurological:  Positive for headaches.   As  noted in HPI  Physical Exam Vital Signs  I have reviewed the triage vital signs BP (!) 156/98 (BP Location: Left Arm)   Pulse 84   Temp 98.3 F (36.8 C) (Oral)   Resp 17   SpO2 99%   Physical Exam Vitals reviewed.  Constitutional:      General: She is not in acute distress.    Appearance: She is well-developed. She is not diaphoretic.  HENT:     Head: Normocephalic and atraumatic.     Nose: Mucosal edema, congestion and rhinorrhea present.     Mouth/Throat:     Pharynx: Oropharynx is clear.  Eyes:     General: No scleral icterus.       Right eye: No discharge.        Left eye: No discharge.     Conjunctiva/sclera: Conjunctivae normal.     Pupils: Pupils are equal, round, and reactive to light.  Cardiovascular:     Rate and Rhythm: Normal rate and regular rhythm.     Heart sounds: No murmur heard.    No friction rub. No gallop.  Pulmonary:     Effort: Pulmonary effort is normal. No respiratory distress.     Breath sounds: Normal breath sounds. No stridor. No rales.  Abdominal:     General: There is no distension.     Palpations: Abdomen is soft.      Tenderness: There is no abdominal tenderness.  Musculoskeletal:        General: No tenderness.     Cervical back: Normal range of motion and neck supple.  Skin:    General: Skin is warm and dry.     Findings: No erythema or rash.  Neurological:     Mental Status: She is alert and oriented to person, place, and time.     ED Results and Treatments Labs (all labs ordered are listed, but only abnormal results are displayed) Labs Reviewed  SARS CORONAVIRUS 2 BY RT PCR  GROUP A STREP BY PCR                                                                                                                         EKG  EKG Interpretation Date/Time:    Ventricular Rate:    PR Interval:    QRS Duration:    QT Interval:    QTC Calculation:   R Axis:      Text Interpretation:         Radiology No results found.  Medications Ordered in ED Medications  acetaminophen (TYLENOL) tablet 650 mg (650 mg Oral Given 01/02/23 0224)   Procedures Procedures  (including critical care time) Medical Decision Making / ED Course   Medical Decision Making Risk OTC drugs.    Patient presents with viral vs allergic rhinitis symptoms for 2 days. Adequate oral hydration. Rest of history as above.  Patient appears well. No signs of toxicity, patient is interactive. No hypoxia, tachypnea or other signs of respiratory  distress. No sign of clinical dehydration. Lung exam clear. Rest of exam as above.  Most consistent with early viral illness vs allergic rhinitis.  Covid negative Strep negative.  No evidence suggestive of pharyngitis, AOM, PNA, or meningitis.  Chest x-ray not indicated at this time.  Discussed symptomatic treatment with the patient and they will follow closely with their PCP.      Final Clinical Impression(s) / ED Diagnoses Final diagnoses:  Nasal congestion  Sore throat   The patient appears reasonably screened and/or stabilized for discharge and I doubt any other  medical condition or other [facility] requiring further screening, evaluation, or treatment in the ED at this time. I have discussed the findings, Dx and Tx plan with the patient/family who expressed understanding and agree(s) with the plan. Discharge instructions discussed at length. The patient/family was given strict return precautions who verbalized understanding of the instructions. No further questions at time of discharge.  Disposition: Discharge  Condition: Good  ED Discharge Orders          Ordered    benzonatate (TESSALON) 100 MG capsule  Every 8 hours        01/02/23 0341              Follow Up: Primary care provider  Call  contact [facility] at [phone] for referral, if you do not have a primary care physician    This chart was dictated using voice recognition software.  Despite best efforts to proofread,  errors can occur which can change the documentation meaning.    Nira Conn, MD 01/02/23 [phone]

## 2023-11-11 ENCOUNTER — Other Ambulatory Visit: Payer: Self-pay | Admitting: Adult Health

## 2023-11-11 DIAGNOSIS — Z1231 Encounter for screening mammogram for malignant neoplasm of breast: Secondary | ICD-10-CM

## 2023-12-03 ENCOUNTER — Ambulatory Visit
Admission: RE | Admit: 2023-12-03 | Discharge: 2023-12-03 | Disposition: A | Discharge status: Home / Self Care | Source: Ambulatory Visit | Attending: Adult Health | Admitting: Adult Health

## 2023-12-03 DIAGNOSIS — Z1231 Encounter for screening mammogram for malignant neoplasm of breast: Secondary | ICD-10-CM

## 2023-12-05 ENCOUNTER — Other Ambulatory Visit: Payer: Self-pay | Admitting: Adult Health

## 2023-12-05 DIAGNOSIS — R928 Other abnormal and inconclusive findings on diagnostic imaging of breast: Secondary | ICD-10-CM

## 2023-12-11 ENCOUNTER — Other Ambulatory Visit

## 2023-12-11 ENCOUNTER — Ambulatory Visit
Admission: RE | Admit: 2023-12-11 | Discharge: 2023-12-11 | Disposition: A | Discharge status: Home / Self Care | Source: Ambulatory Visit | Attending: Adult Health | Admitting: Adult Health

## 2023-12-11 DIAGNOSIS — R928 Other abnormal and inconclusive findings on diagnostic imaging of breast: Secondary | ICD-10-CM
